# Patient Record
Sex: Female | Born: 1960 | Race: White | Hispanic: No | Marital: Married | Smoking: Never smoker
Health system: Southern US, Community
[De-identification: ages and names within clinical notes are randomized; demographics above are authoritative.]

## PROBLEM LIST (undated history)

## (undated) DIAGNOSIS — H409 Unspecified glaucoma: Secondary | ICD-10-CM

## (undated) DIAGNOSIS — I10 Essential (primary) hypertension: Secondary | ICD-10-CM

## (undated) HISTORY — PX: BREAST SURGERY: SHX581

## (undated) HISTORY — PX: EYE SURGERY: SHX253

## (undated) HISTORY — DX: Essential (primary) hypertension: I10

## (undated) HISTORY — DX: Unspecified glaucoma: H40.9

---
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
## 2004-08-30 ENCOUNTER — Other Ambulatory Visit: Admission: RE | Admit: 2004-08-30 | Discharge: 2004-08-30 | Payer: Self-pay | Admitting: Gynecology

## 2004-09-29 ENCOUNTER — Encounter: Admission: RE | Admit: 2004-09-29 | Discharge: 2004-09-29 | Payer: Self-pay | Admitting: Gynecology

## 2008-11-05 HISTORY — PX: ABDOMINAL HYSTERECTOMY: SHX81

## 2009-04-29 ENCOUNTER — Encounter: Admission: RE | Admit: 2009-04-29 | Discharge: 2009-04-29 | Payer: Self-pay | Admitting: Gynecology

## 2009-05-12 ENCOUNTER — Encounter: Admission: RE | Admit: 2009-05-12 | Discharge: 2009-05-12 | Payer: Self-pay | Admitting: Gynecology

## 2010-05-03 ENCOUNTER — Encounter: Admission: RE | Admit: 2010-05-03 | Discharge: 2010-05-03 | Payer: Self-pay | Admitting: Gynecology

## 2011-04-30 ENCOUNTER — Other Ambulatory Visit: Payer: Self-pay | Admitting: Gynecology

## 2011-04-30 DIAGNOSIS — Z1231 Encounter for screening mammogram for malignant neoplasm of breast: Secondary | ICD-10-CM

## 2011-05-08 ENCOUNTER — Ambulatory Visit: Payer: Self-pay

## 2011-05-10 ENCOUNTER — Ambulatory Visit
Admission: RE | Admit: 2011-05-10 | Discharge: 2011-05-10 | Disposition: A | Discharge status: Home / Self Care | Payer: Federal, State, Local not specified - PPO | Source: Ambulatory Visit | Attending: Gynecology | Admitting: Gynecology

## 2011-05-10 DIAGNOSIS — Z1231 Encounter for screening mammogram for malignant neoplasm of breast: Secondary | ICD-10-CM

## 2012-04-03 ENCOUNTER — Other Ambulatory Visit: Payer: Self-pay | Admitting: Gynecology

## 2012-04-03 DIAGNOSIS — Z1231 Encounter for screening mammogram for malignant neoplasm of breast: Secondary | ICD-10-CM

## 2012-05-16 ENCOUNTER — Ambulatory Visit: Payer: Federal, State, Local not specified - PPO

## 2012-05-26 ENCOUNTER — Ambulatory Visit: Payer: Federal, State, Local not specified - PPO

## 2012-06-18 ENCOUNTER — Ambulatory Visit
Admission: RE | Admit: 2012-06-18 | Discharge: 2012-06-18 | Disposition: A | Discharge status: Home / Self Care | Payer: Federal, State, Local not specified - PPO | Source: Ambulatory Visit | Attending: Gynecology | Admitting: Gynecology

## 2012-06-18 DIAGNOSIS — Z1231 Encounter for screening mammogram for malignant neoplasm of breast: Secondary | ICD-10-CM

## 2012-09-23 DIAGNOSIS — H547 Unspecified visual loss: Secondary | ICD-10-CM | POA: Insufficient documentation

## 2012-12-12 ENCOUNTER — Other Ambulatory Visit: Payer: Self-pay | Admitting: *Deleted

## 2012-12-12 DIAGNOSIS — R911 Solitary pulmonary nodule: Secondary | ICD-10-CM

## 2012-12-15 ENCOUNTER — Ambulatory Visit
Admission: RE | Admit: 2012-12-15 | Discharge: 2012-12-15 | Disposition: A | Discharge status: Home / Self Care | Payer: Federal, State, Local not specified - PPO | Source: Ambulatory Visit | Attending: *Deleted | Admitting: *Deleted

## 2012-12-15 DIAGNOSIS — R911 Solitary pulmonary nodule: Secondary | ICD-10-CM

## 2012-12-15 DIAGNOSIS — R0683 Snoring: Secondary | ICD-10-CM | POA: Insufficient documentation

## 2013-08-20 ENCOUNTER — Other Ambulatory Visit: Payer: Self-pay

## 2013-08-20 DIAGNOSIS — Z1231 Encounter for screening mammogram for malignant neoplasm of breast: Secondary | ICD-10-CM

## 2013-09-18 ENCOUNTER — Ambulatory Visit
Admission: RE | Admit: 2013-09-18 | Discharge: 2013-09-18 | Disposition: A | Discharge status: Home / Self Care | Payer: Federal, State, Local not specified - PPO | Source: Ambulatory Visit

## 2013-09-18 DIAGNOSIS — Z1231 Encounter for screening mammogram for malignant neoplasm of breast: Secondary | ICD-10-CM

## 2013-10-28 ENCOUNTER — Other Ambulatory Visit (HOSPITAL_COMMUNITY)
Admission: RE | Admit: 2013-10-28 | Discharge: 2013-10-28 | Disposition: A | Discharge status: Home / Self Care | Payer: Federal, State, Local not specified - PPO | Source: Ambulatory Visit | Attending: Gynecology | Admitting: Gynecology

## 2013-10-28 ENCOUNTER — Ambulatory Visit (INDEPENDENT_AMBULATORY_CARE_PROVIDER_SITE_OTHER): Payer: Federal, State, Local not specified - PPO | Admitting: Gynecology

## 2013-10-28 ENCOUNTER — Encounter: Payer: Self-pay | Admitting: Gynecology

## 2013-10-28 VITALS — BP 110/68 | Ht 62.0 in | Wt 190.4 lb

## 2013-10-28 DIAGNOSIS — Z01419 Encounter for gynecological examination (general) (routine) without abnormal findings: Secondary | ICD-10-CM | POA: Insufficient documentation

## 2013-10-28 DIAGNOSIS — Z79890 Hormone replacement therapy (postmenopausal): Secondary | ICD-10-CM

## 2013-10-28 MED ORDER — ESTRADIOL 0.05 MG/24HR TD PTTW: 1.0000 | MEDICATED_PATCH | TRANSDERMAL | Status: DC

## 2013-10-28 NOTE — Progress Notes (Signed)
Shelly Stanton 31-Jul-1961 [id number]        52 y.o.  G0P0 new patient for annual exam.  Former patient of Dr. Nicholas Lose. Several issues noted below.  Past medical history,surgical history, problem list, medications, allergies, family history and social history were all reviewed and documented in the EPIC chart.  ROS:  Performed and pertinent positives and negatives are included in the history, assessment and plan .  Exam: Shelly Stanton Vitals:   10/28/13 0920  BP: 110/68  Height: 5\' 2"  (1.575 m)  Weight: 190 lb 6.4 oz (86.365 kg)   General appearance  Normal Skin grossly normal Head/Neck normal with no cervical or supraclavicular adenopathy thyroid normal Lungs  clear Cardiac RR, without RMG Abdominal  soft, nontender, without masses, organomegaly or hernia Breasts  examined lying and sitting without masses, retractions, discharge or axillary adenopathy. Bilateral reduction scars  Pelvic  Ext/BUS/vagina  Normal Pap of cuff done  Adnexa  Without masses or tenderness    Anus and perineum  Normal   Rectovaginal  Normal sphincter tone without palpated masses or tenderness.    Assessment/Plan:  52 y.o. G0P0 female for annual exam.   1.  Status post TAH/BSO for endometriosis. Was done at [facility] for fear of ovarian cancer due to large cystic mass that was benign. Has been on ERT of Minivelle 0.05 since. Has done well with this and wants to continue.  I reviewed the whole issue of HRT with her to include the WHI study with increased risk of stroke, heart attack, DVT and breast cancer. The ACOG and NAMS statements for lowest dose for the shortest period of time reviewed. Transdermal versus oral first-pass effect benefit discussed.  After lengthy discussion the patient wants to continue it I refilled her x1 year. 2. Pap smear thought to be last year. Do not have copies of these. No history of abnormal Pap smears previously by her history. I did a Pap of the cuff today. Options to stop  screening altogether as she is status post hysterectomy for benign indications or less frequent screening intervals reviewed. We'll readdress on an annual basis. 3. Mammography 09/2013. We'll continue with annual mammography. 4. Colonoscopy 2011. Repeat at their recommended interval. 5. DEXA never. We'll plan closer to 61. Increase calcium vitamin D reviewed. 6. Health maintenance. No blood work done as she reports this is all done through her primary physician's office. Followup one year, sooner as needed.   Note: This document was prepared with digital dictation and possible smart phrase technology. Any transcriptional errors that result from this process are unintentional.   Shelly Lords MD, 9:59 AM 10/28/2013

## 2013-10-28 NOTE — Patient Instructions (Signed)
Follow up in one year, sooner as needed. 

## 2013-10-28 NOTE — Addendum Note (Signed)
Addended by: Richardson Chiquito on: 10/28/2013 11:26 AM   Modules accepted: Orders

## 2013-10-29 LAB — URINALYSIS W MICROSCOPIC + REFLEX CULTURE
Bacteria, UA: NONE SEEN
Bilirubin Urine: NEGATIVE
Casts: NONE SEEN
Crystals: NONE SEEN
Glucose, UA: NEGATIVE mg/dL
Hgb urine dipstick: NEGATIVE
Ketones, ur: NEGATIVE mg/dL
Leukocytes, UA: NEGATIVE
Nitrite: NEGATIVE
Protein, ur: NEGATIVE mg/dL
Specific Gravity, Urine: 1.011 (ref 1.005–1.030)
Urobilinogen, UA: 0.2 mg/dL (ref 0.0–1.0)
pH: 7 (ref 5.0–8.0)

## 2013-11-06 ENCOUNTER — Other Ambulatory Visit: Payer: Self-pay | Admitting: Gynecology

## 2013-11-06 MED ORDER — FLUCONAZOLE 150 MG PO TABS: 150.0000 mg | ORAL_TABLET | Freq: Once | ORAL | Status: DC

## 2014-03-25 DIAGNOSIS — N951 Menopausal and female climacteric states: Secondary | ICD-10-CM | POA: Insufficient documentation

## 2014-08-02 ENCOUNTER — Telehealth: Payer: Self-pay | Admitting: *Deleted

## 2014-08-02 ENCOUNTER — Other Ambulatory Visit: Payer: Self-pay

## 2014-08-02 DIAGNOSIS — Z1231 Encounter for screening mammogram for malignant neoplasm of breast: Secondary | ICD-10-CM

## 2014-08-02 NOTE — Telephone Encounter (Signed)
Pt called requesting name of breast imaging center I gave her the name of the breast center of [place].

## 2014-09-20 ENCOUNTER — Ambulatory Visit: Payer: Federal, State, Local not specified - PPO

## 2014-10-12 ENCOUNTER — Ambulatory Visit
Admission: RE | Admit: 2014-10-12 | Discharge: 2014-10-12 | Disposition: A | Discharge status: Home / Self Care | Payer: Federal, State, Local not specified - PPO | Source: Ambulatory Visit

## 2014-10-12 DIAGNOSIS — Z1231 Encounter for screening mammogram for malignant neoplasm of breast: Secondary | ICD-10-CM

## 2014-11-01 ENCOUNTER — Ambulatory Visit (INDEPENDENT_AMBULATORY_CARE_PROVIDER_SITE_OTHER): Payer: Federal, State, Local not specified - PPO | Admitting: Gynecology

## 2014-11-01 ENCOUNTER — Encounter: Payer: Self-pay | Admitting: Gynecology

## 2014-11-01 ENCOUNTER — Other Ambulatory Visit: Payer: Self-pay | Admitting: Gynecology

## 2014-11-01 VITALS — BP 126/82 | Ht 62.0 in | Wt 186.0 lb

## 2014-11-01 DIAGNOSIS — Z01419 Encounter for gynecological examination (general) (routine) without abnormal findings: Secondary | ICD-10-CM

## 2014-11-01 DIAGNOSIS — Z79890 Hormone replacement therapy (postmenopausal): Secondary | ICD-10-CM

## 2014-11-01 MED ORDER — ESTRADIOL 0.05 MG/24HR TD PTTW: 1.0000 | MEDICATED_PATCH | TRANSDERMAL | Status: DC

## 2014-11-01 NOTE — Patient Instructions (Signed)
You may obtain a copy of any labs that were done today by logging onto MyChart as outlined in the instructions provided with your AVS (after visit summary). The office will not call with normal lab results but certainly if there are any significant abnormalities then we will contact you.   Health Maintenance, Female A healthy lifestyle and preventative care can promote health and wellness.  Maintain regular health, dental, and eye exams.  Eat a healthy diet. Foods like vegetables, fruits, whole grains, low-fat dairy products, and lean protein foods contain the nutrients you need without too many calories. Decrease your intake of foods high in solid fats, added sugars, and salt. Get information about a proper diet from your caregiver, if necessary.  Regular physical exercise is one of the most important things you can do for your health. Most adults should get at least 150 minutes of moderate-intensity exercise (any activity that increases your heart rate and causes you to sweat) each week. In addition, most adults need muscle-strengthening exercises on 2 or more days a week.   Maintain a healthy weight. The body mass index (BMI) is a screening tool to identify possible weight problems. It provides an estimate of body fat based on height and weight. Your caregiver can help determine your BMI, and can help you achieve or maintain a healthy weight. For adults 20 years and older:  A BMI below 18.5 is considered underweight.  A BMI of 18.5 to 24.9 is normal.  A BMI of 25 to 29.9 is considered overweight.  A BMI of 30 and above is considered obese.  Maintain normal blood lipids and cholesterol by exercising and minimizing your intake of saturated fat. Eat a balanced diet with plenty of fruits and vegetables. Blood tests for lipids and cholesterol should begin at age 61 and be repeated every 5 years. If your lipid or cholesterol levels are high, you are over 50, or you are a high risk for heart  disease, you may need your cholesterol levels checked more frequently.Ongoing high lipid and cholesterol levels should be treated with medicines if diet and exercise are not effective.  If you smoke, find out from your caregiver how to quit. If you do not use tobacco, do not start.  Lung cancer screening is recommended for adults aged 33 80 years who are at high risk for developing lung cancer because of a history of smoking. Yearly low-dose computed tomography (CT) is recommended for people who have at least a 30-pack-year history of smoking and are a current smoker or have quit within the past 15 years. A pack year of smoking is smoking an average of 1 pack of cigarettes a day for 1 year (for example: 1 pack a day for 30 years or 2 packs a day for 15 years). Yearly screening should continue until the smoker has stopped smoking for at least 15 years. Yearly screening should also be stopped for people who develop a health problem that would prevent them from having lung cancer treatment.  If you are pregnant, do not drink alcohol. If you are breastfeeding, be very cautious about drinking alcohol. If you are not pregnant and choose to drink alcohol, do not exceed 1 drink per day. One drink is considered to be 12 ounces (355 mL) of beer, 5 ounces (148 mL) of wine, or 1.5 ounces (44 mL) of liquor.  Avoid use of street drugs. Do not share needles with anyone. Ask for help if you need support or instructions about stopping  the use of drugs.  High blood pressure causes heart disease and increases the risk of stroke. Blood pressure should be checked at least every 1 to 2 years. Ongoing high blood pressure should be treated with medicines, if weight loss and exercise are not effective.  If you are 59 to 52 years old, ask your caregiver if you should take aspirin to prevent strokes.  Diabetes screening involves taking a blood sample to check your fasting blood sugar level. This should be done once every 3  years, after age 91, if you are within normal weight and without risk factors for diabetes. Testing should be considered at a younger age or be carried out more frequently if you are overweight and have at least 1 risk factor for diabetes.  Breast cancer screening is essential preventative care for women. You should practice "breast self-awareness." This means understanding the normal appearance and feel of your breasts and may include breast self-examination. Any changes detected, no matter how small, should be reported to a caregiver. Women in their 66s and 30s should have a clinical breast exam (CBE) by a caregiver as part of a regular health exam every 1 to 3 years. After age 101, women should have a CBE every year. Starting at age 100, women should consider having a mammogram (breast X-ray) every year. Women who have a family history of breast cancer should talk to their caregiver about genetic screening. Women at a high risk of breast cancer should talk to their caregiver about having an MRI and a mammogram every year.  Breast cancer gene (BRCA)-related cancer risk assessment is recommended for women who have family members with BRCA-related cancers. BRCA-related cancers include breast, ovarian, tubal, and peritoneal cancers. Having family members with these cancers may be associated with an increased risk for harmful changes (mutations) in the breast cancer genes BRCA1 and BRCA2. Results of the assessment will determine the need for genetic counseling and BRCA1 and BRCA2 testing.  The Pap test is a screening test for cervical cancer. Women should have a Pap test starting at age 57. Between ages 25 and 35, Pap tests should be repeated every 2 years. Beginning at age 37, you should have a Pap test every 3 years as long as the past 3 Pap tests have been normal. If you had a hysterectomy for a problem that was not cancer or a condition that could lead to cancer, then you no longer need Pap tests. If you are  between ages 50 and 76, and you have had normal Pap tests going back 10 years, you no longer need Pap tests. If you have had past treatment for cervical cancer or a condition that could lead to cancer, you need Pap tests and screening for cancer for at least 20 years after your treatment. If Pap tests have been discontinued, risk factors (such as a new sexual partner) need to be reassessed to determine if screening should be resumed. Some women have medical problems that increase the chance of getting cervical cancer. In these cases, your caregiver may recommend more frequent screening and Pap tests.  The human papillomavirus (HPV) test is an additional test that may be used for cervical cancer screening. The HPV test looks for the virus that can cause the cell changes on the cervix. The cells collected during the Pap test can be tested for HPV. The HPV test could be used to screen women aged 44 years and older, and should be used in women of any age  who have unclear Pap test results. After the age of 55, women should have HPV testing at the same frequency as a Pap test.  Colorectal cancer can be detected and often prevented. Most routine colorectal cancer screening begins at the age of 44 and continues through age 20. However, your caregiver may recommend screening at an earlier age if you have risk factors for colon cancer. On a yearly basis, your caregiver may provide home test kits to check for hidden blood in the stool. Use of a small camera at the end of a tube, to directly examine the colon (sigmoidoscopy or colonoscopy), can detect the earliest forms of colorectal cancer. Talk to your caregiver about this at age 86, when routine screening begins. Direct examination of the colon should be repeated every 5 to 10 years through age 13, unless early forms of pre-cancerous polyps or small growths are found.  Hepatitis C blood testing is recommended for all people born from [birth year] through 1964 and any  individual with known risks for hepatitis C.  Practice safe sex. Use condoms and avoid high-risk sexual practices to reduce the spread of sexually transmitted infections (STIs). Sexually active women aged 36 and younger should be checked for Chlamydia, which is a common sexually transmitted infection. Older women with new or multiple partners should also be tested for Chlamydia. Testing for other STIs is recommended if you are sexually active and at increased risk.  Osteoporosis is a disease in which the bones lose minerals and strength with aging. This can result in serious bone fractures. The risk of osteoporosis can be identified using a bone density scan. Women ages 20 and over and women at risk for fractures or osteoporosis should discuss screening with their caregivers. Ask your caregiver whether you should be taking a calcium supplement or vitamin D to reduce the rate of osteoporosis.  Menopause can be associated with physical symptoms and risks. Hormone replacement therapy is available to decrease symptoms and risks. You should talk to your caregiver about whether hormone replacement therapy is right for you.  Use sunscreen. Apply sunscreen liberally and repeatedly throughout the day. You should seek shade when your shadow is shorter than you. Protect yourself by wearing long sleeves, pants, a wide-brimmed hat, and sunglasses year round, whenever you are outdoors.  Notify your caregiver of new moles or changes in moles, especially if there is a change in shape or color. Also notify your caregiver if a mole is larger than the size of a pencil eraser.  Stay current with your immunizations. Document Released: 05/07/2011 Document Revised: 02/16/2013 Document Reviewed: 05/07/2011 [facility] Patient Information 2014 [employer].

## 2014-11-01 NOTE — Progress Notes (Signed)
Tanya NonesSylvia C Knab June 30, 1961 [id number]        53 y.o.  G0P0 for annual exam.  Doing well. Several issues noted below.  Past medical history,surgical history, problem list, medications, allergies, family history and social history were all reviewed and documented as reviewed in the EPIC chart.  ROS:  Performed with pertinent positives and negatives included in the history, assessment and plan.   Additional significant findings :  none   Exam: Charity fundraiserBlanca assistant Filed Vitals:   11/01/14 0930  BP: 126/82  Height: 5\' 2"  (1.575 m)  Weight: 186 lb (84.369 kg)   General appearance:  Normal affect, orientation and appearance. Skin: Grossly normal HEENT: Without gross lesions.  No cervical or supraclavicular adenopathy. Thyroid normal.  Lungs:  Clear without wheezing, rales or rhonchi Cardiac: RR, without RMG Abdominal:  Soft, nontender, without masses, guarding, rebound, organomegaly or hernia Breasts:  Examined lying and sitting without masses, retractions, discharge or axillary adenopathy.  Bilateral reduction scars noted. Pelvic:  Ext/BUS/vagina normal  Adnexa  Without masses or tenderness    Anus and perineum  Normal   Rectovaginal  Normal sphincter tone without palpated masses or tenderness.    Assessment/Plan:  53 y.o. G0P0 female for annual exam.   1. Status post TAH/BSO for endometriosis. Continues on MiniVivelle 0.05 mg patches. Doing well and wants to continue. I again reviewed the whole issue of HRT to include increased risk of stroke heart attack DVT and breast cancer. The WHI study as well as ACOG and NAMS statements for lowest dose per shortest period of time all reviewed. Patient's comfortable with continuing I refilled her 1 year. 2. Pap smear 2014. No Pap smear done today. No history of significant abnormal Pap smears. Options to stop screening altogether or less frequent screening intervals reviewed. Will readdress on an annual basis. 3. Mammography 10/2014. Continue with  annual mammography. SBE monthly reviewed. 4. DEXA never. We'll plan at age 53. Patient is on ERT which certainly should help as far as bone health.  Increased calcium vitamin D recommendations reviewed. 5. Colonoscopy 2011. Repeat at their recommended interval. 6. Health maintenance. No routine blood work done as she reports this done at her primary physician's office. Follow up 1 year, sooner as needed.     Dara LordsFONTAINE,Gwendolen Hewlett P MD, 9:54 AM 11/01/2014

## 2014-11-02 LAB — URINALYSIS W MICROSCOPIC + REFLEX CULTURE
Bilirubin Urine: NEGATIVE
CASTS: NONE SEEN
Crystals: NONE SEEN
Glucose, UA: NEGATIVE mg/dL
HGB URINE DIPSTICK: NEGATIVE
KETONES UR: NEGATIVE mg/dL
NITRITE: NEGATIVE
PH: 5.5 (ref 5.0–8.0)
Protein, ur: NEGATIVE mg/dL
Specific Gravity, Urine: 1.012 (ref 1.005–1.030)
UROBILINOGEN UA: 0.2 mg/dL (ref 0.0–1.0)

## 2014-11-03 LAB — URINE CULTURE: Colony Count: >=100000

## 2014-11-04 ENCOUNTER — Other Ambulatory Visit: Payer: Self-pay | Admitting: Gynecology

## 2014-11-04 DIAGNOSIS — R8271 Bacteriuria: Secondary | ICD-10-CM

## 2014-11-12 ENCOUNTER — Other Ambulatory Visit: Payer: Federal, State, Local not specified - PPO

## 2014-11-12 DIAGNOSIS — R8271 Bacteriuria: Secondary | ICD-10-CM

## 2014-11-13 LAB — URINALYSIS W MICROSCOPIC + REFLEX CULTURE
BILIRUBIN URINE: NEGATIVE
Bacteria, UA: NONE SEEN
Casts: NONE SEEN
Crystals: NONE SEEN
Glucose, UA: NEGATIVE mg/dL
Hgb urine dipstick: NEGATIVE
Ketones, ur: NEGATIVE mg/dL
Leukocytes, UA: NEGATIVE
Nitrite: NEGATIVE
Protein, ur: NEGATIVE mg/dL
Specific Gravity, Urine: 1.006 (ref 1.005–1.030)
Squamous Epithelial / LPF: NONE SEEN
UROBILINOGEN UA: 0.2 mg/dL (ref 0.0–1.0)
pH: 6.5 (ref 5.0–8.0)

## 2015-09-26 ENCOUNTER — Telehealth: Payer: Self-pay | Admitting: *Deleted

## 2015-09-26 ENCOUNTER — Other Ambulatory Visit: Payer: Self-pay

## 2015-09-26 DIAGNOSIS — Z1231 Encounter for screening mammogram for malignant neoplasm of breast: Secondary | ICD-10-CM

## 2015-09-26 MED ORDER — ESTRADIOL 0.05 MG/24HR TD PTTW: 1.0000 | MEDICATED_PATCH | TRANSDERMAL | Status: DC

## 2015-09-26 NOTE — Telephone Encounter (Signed)
Pt has annual scheduled on 11/03/15, needs refill on minivelle patch, Rx sent.

## 2015-10-28 ENCOUNTER — Ambulatory Visit
Admission: RE | Admit: 2015-10-28 | Discharge: 2015-10-28 | Disposition: A | Discharge status: Home / Self Care | Payer: Federal, State, Local not specified - PPO | Source: Ambulatory Visit

## 2015-10-28 DIAGNOSIS — Z1231 Encounter for screening mammogram for malignant neoplasm of breast: Secondary | ICD-10-CM

## 2015-11-03 ENCOUNTER — Ambulatory Visit (INDEPENDENT_AMBULATORY_CARE_PROVIDER_SITE_OTHER): Payer: Federal, State, Local not specified - PPO | Admitting: Gynecology

## 2015-11-03 ENCOUNTER — Encounter: Payer: Self-pay | Admitting: Gynecology

## 2015-11-03 VITALS — BP 128/84 | Ht 62.0 in | Wt 197.6 lb

## 2015-11-03 DIAGNOSIS — Z01419 Encounter for gynecological examination (general) (routine) without abnormal findings: Secondary | ICD-10-CM

## 2015-11-03 DIAGNOSIS — Z79890 Hormone replacement therapy (postmenopausal): Secondary | ICD-10-CM | POA: Diagnosis not present

## 2015-11-03 MED ORDER — ESTRADIOL 0.05 MG/24HR TD PTTW: MEDICATED_PATCH | TRANSDERMAL | Status: DC

## 2015-11-03 NOTE — Progress Notes (Signed)
Tanya NonesSylvia C Forrest 21-Nov-1960 [id number]        54 y.o.  G0P0  for annual exam.  Doing well without complaints  Past medical history,surgical history, problem list, medications, allergies, family history and social history were all reviewed and documented as reviewed in the EPIC chart.  ROS:  Performed with pertinent positives and negatives included in the history, assessment and plan.   Additional significant findings :  none   Exam: Charity fundraiserBlanca assistant Filed Vitals:   11/03/15 1000  BP: 128/84  Height: 5\' 2"  (1.575 m)  Weight: 197 lb 9.6 oz (89.631 kg)   General appearance:  Normal affect, orientation and appearance. Skin: Grossly normal HEENT: Without gross lesions.  No cervical or supraclavicular adenopathy. Thyroid normal.  Lungs:  Clear without wheezing, rales or rhonchi Cardiac: RR, without RMG Abdominal:  Soft, nontender, without masses, guarding, rebound, organomegaly or hernia Breasts:  Examined lying and sitting without masses, retractions, discharge or axillary adenopathy. Pelvic:  Ext/BUS/vagina normal with mild atrophic changes  Adnexa  Without masses or tenderness    Anus and perineum  Normal   Rectovaginal  Normal sphincter tone without palpated masses or tenderness.    Assessment/Plan:  54 y.o. G0P0 female for annual exam.   1. Status post TAH/BSO for endometriosis. Continues on minivelle 0.05 mg patches. She is doing well and wants to continue. I again reviewed the whole issue of HRT to include the WHI study with increased risk of stroke heart attack DVT and breast cancer. The recommendations for lowest dose for shortest period of time discussed. At this point she wants to continue understands and accepts risks and I refilled her 1 year. 2. Pap smear 2014. No Pap smear done today. No history of abnormal Pap smears. Options to stop screening per current screening guidelines as she is status post hysterectomy for benign indications versus less frequent screening  intervals reviewed. Will readdress on annual basis. 3. Mammography last week. Continue with annual mammography when due. SBE monthly reviewed. 4. Colonoscopy 2016. Repeat at their recommended interval. 5. DEXA never. We'll plan at age 54. Increase calcium and vitamin D reviewed. 6. Health maintenance. No routine blood work done as this is done at her primary physician's office. Follow up 1 year, sooner as needed.   Dara LordsFONTAINE,TIMOTHY P MD, 10:16 AM 11/03/2015

## 2015-11-03 NOTE — Patient Instructions (Signed)
You may obtain a copy of any labs that were done today by logging onto MyChart as outlined in the instructions provided with your AVS (after visit summary). The office will not call with normal lab results but certainly if there are any significant abnormalities then we will contact you.   Health Maintenance Adopting a healthy lifestyle and getting preventive care can go a long way to promote health and wellness. Talk with your health care Lilley Hubble about what schedule of regular examinations is right for you. This is a good chance for you to check in with your Kiano Terrien about disease prevention and staying healthy. In between checkups, there are plenty of things you can do on your own. Experts have done a lot of research about which lifestyle changes and preventive measures are most likely to keep you healthy. Ask your health care Jamani Eley for more information. WEIGHT AND DIET  Eat a healthy diet  Be sure to include plenty of vegetables, fruits, low-fat dairy products, and lean protein.  Do not eat a lot of foods high in solid fats, added sugars, or salt.  Get regular exercise. This is one of the most important things you can do for your health.  Most adults should exercise for at least 150 minutes each week. The exercise should increase your heart rate and make you sweat (moderate-intensity exercise).  Most adults should also do strengthening exercises at least twice a week. This is in addition to the moderate-intensity exercise.  Maintain a healthy weight  Body mass index (BMI) is a measurement that can be used to identify possible weight problems. It estimates body fat based on height and weight. Your health care Joffrey Kerce can help determine your BMI and help you achieve or maintain a healthy weight.  For females 43 years of age and older:   A BMI below 18.5 is considered underweight.  A BMI of 18.5 to 24.9 is normal.  A BMI of 25 to 29.9 is considered overweight.  A BMI of 30 and  above is considered obese.  Watch levels of cholesterol and blood lipids  You should start having your blood tested for lipids and cholesterol at 53 years of age, then have this test every 5 years.  You may need to have your cholesterol levels checked more often if:  Your lipid or cholesterol levels are high.  You are older than 53 years of age.  You are at high risk for heart disease.  CANCER SCREENING   Lung Cancer  Lung cancer screening is recommended for adults 8-51 years old who are at high risk for lung cancer because of a history of smoking.  A yearly low-dose CT scan of the lungs is recommended for people who:  Currently smoke.  Have quit within the past 15 years.  Have at least a 30-pack-year history of smoking. A pack year is smoking an average of one pack of cigarettes a day for 1 year.  Yearly screening should continue until it has been 15 years since you quit.  Yearly screening should stop if you develop a health problem that would prevent you from having lung cancer treatment.  Breast Cancer  Practice breast self-awareness. This means understanding how your breasts normally appear and feel.  It also means doing regular breast self-exams. Let your health care Fredda Clarida know about any changes, no matter how small.  If you are in your 20s or 30s, you should have a clinical breast exam (CBE) by a health care Neiva Maenza every 1-3  years as part of a regular health exam.  If you are 32 or older, have a CBE every year. Also consider having a breast X-ray (mammogram) every year.  If you have a family history of breast cancer, talk to your health care Sina Lucchesi about genetic screening.  If you are at high risk for breast cancer, talk to your health care Jimmi Sidener about having an MRI and a mammogram every year.  Breast cancer gene (BRCA) assessment is recommended for women who have family members with BRCA-related cancers. BRCA-related cancers  include:  Breast.  Ovarian.  Tubal.  Peritoneal cancers.  Results of the assessment will determine the need for genetic counseling and BRCA1 and BRCA2 testing. Cervical Cancer Routine pelvic examinations to screen for cervical cancer are no longer recommended for nonpregnant women who are considered low risk for cancer of the pelvic organs (ovaries, uterus, and vagina) and who do not have symptoms. A pelvic examination may be necessary if you have symptoms including those associated with pelvic infections. Ask your health care Tanvir Hipple if a screening pelvic exam is right for you.   The Pap test is the screening test for cervical cancer for women who are considered at risk.  If you had a hysterectomy for a problem that was not cancer or a condition that could lead to cancer, then you no longer need Pap tests.  If you are older than 65 years, and you have had normal Pap tests for the past 10 years, you no longer need to have Pap tests.  If you have had past treatment for cervical cancer or a condition that could lead to cancer, you need Pap tests and screening for cancer for at least 20 years after your treatment.  If you no longer get a Pap test, assess your risk factors if they change (such as having a new sexual partner). This can affect whether you should start being screened again.  Some women have medical problems that increase their chance of getting cervical cancer. If this is the case for you, your health care Andris Brothers may recommend more frequent screening and Pap tests.  The human papillomavirus (HPV) test is another test that may be used for cervical cancer screening. The HPV test looks for the virus that can cause cell changes in the cervix. The cells collected during the Pap test can be tested for HPV.  The HPV test can be used to screen women 34 years of age and older. Getting tested for HPV can extend the interval between normal Pap tests from three to five years.  An HPV  test also should be used to screen women of any age who have unclear Pap test results.  After 53 years of age, women should have HPV testing as often as Pap tests.  Colorectal Cancer  This type of cancer can be detected and often prevented.  Routine colorectal cancer screening usually begins at 53 years of age and continues through 53 years of age.  Your health care Mahmud Keithly may recommend screening at an earlier age if you have risk factors for colon cancer.  Your health care Aerica Rincon may also recommend using home test kits to check for hidden blood in the stool.  A small camera at the end of a tube can be used to examine your colon directly (sigmoidoscopy or colonoscopy). This is done to check for the earliest forms of colorectal cancer.  Routine screening usually begins at age 58.  Direct examination of the colon should be repeated  every 5-10 years through 53 years of age. However, you may need to be screened more often if early forms of precancerous polyps or small growths are found. Skin Cancer  Check your skin from head to toe regularly.  Tell your health care Zubin Pontillo about any new moles or changes in moles, especially if there is a change in a mole's shape or color.  Also tell your health care Moishy Laday if you have a mole that is larger than the size of a pencil eraser.  Always use sunscreen. Apply sunscreen liberally and repeatedly throughout the day.  Protect yourself by wearing long sleeves, pants, a wide-brimmed hat, and sunglasses whenever you are outside. HEART DISEASE, DIABETES, AND HIGH BLOOD PRESSURE   Have your blood pressure checked at least every 1-2 years. High blood pressure causes heart disease and increases the risk of stroke.  If you are between 70 years and 63 years old, ask your health care Noami Bove if you should take aspirin to prevent strokes.  Have regular diabetes screenings. This involves taking a blood sample to check your fasting blood sugar  level.  If you are at a normal weight and have a low risk for diabetes, have this test once every three years after 53 years of age.  If you are overweight and have a high risk for diabetes, consider being tested at a younger age or more often. PREVENTING INFECTION  Hepatitis B  If you have a higher risk for hepatitis B, you should be screened for this virus. You are considered at high risk for hepatitis B if:  You were born in a country where hepatitis B is common. Ask your health care Joushua Dugar which countries are considered high risk.  Your parents were born in a high-risk country, and you have not been immunized against hepatitis B (hepatitis B vaccine).  You have HIV or AIDS.  You use needles to inject street drugs.  You live with someone who has hepatitis B.  You have had sex with someone who has hepatitis B.  You get hemodialysis treatment.  You take certain medicines for conditions, including cancer, organ transplantation, and autoimmune conditions. Hepatitis C  Blood testing is recommended for:  Everyone born from [birth year] through 1964.  Anyone with known risk factors for hepatitis C. Sexually transmitted infections (STIs)  You should be screened for sexually transmitted infections (STIs) including gonorrhea and chlamydia if:  You are sexually active and are younger than 53 years of age.  You are older than 53 years of age and your health care Jashayla Glatfelter tells you that you are at risk for this type of infection.  Your sexual activity has changed since you were last screened and you are at an increased risk for chlamydia or gonorrhea. Ask your health care Sallee Hogrefe if you are at risk.  If you do not have HIV, but are at risk, it may be recommended that you take a prescription medicine daily to prevent HIV infection. This is called pre-exposure prophylaxis (PrEP). You are considered at risk if:  You are sexually active and do not regularly use condoms or know the HIV status  of your partner(s).  You take drugs by injection.  You are sexually active with a partner who has HIV. Talk with your health care Dajon Rowe about whether you are at high risk of being infected with HIV. If you choose to begin PrEP, you should first be tested for HIV. You should then be tested every 3 months for as long as  you are taking PrEP.  PREGNANCY   If you are premenopausal and you may become pregnant, ask your health care Brantley Naser about preconception counseling.  If you may become pregnant, take 400 to 800 micrograms (mcg) of folic acid every day.  If you want to prevent pregnancy, talk to your health care Jennalyn Cawley about birth control (contraception). OSTEOPOROSIS AND MENOPAUSE   Osteoporosis is a disease in which the bones lose minerals and strength with aging. This can result in serious bone fractures. Your risk for osteoporosis can be identified using a bone density scan.  If you are 65 years of age or older, or if you are at risk for osteoporosis and fractures, ask your health care Faust Thorington if you should be screened.  Ask your health care Renna Kilmer whether you should take a calcium or vitamin D supplement to lower your risk for osteoporosis.  Menopause may have certain physical symptoms and risks.  Hormone replacement therapy may reduce some of these symptoms and risks. Talk to your health care Tatanisha Cuthbert about whether hormone replacement therapy is right for you.  HOME CARE INSTRUCTIONS   Schedule regular health, dental, and eye exams.  Stay current with your immunizations.   Do not use any tobacco products including cigarettes, chewing tobacco, or electronic cigarettes.  If you are pregnant, do not drink alcohol.  If you are breastfeeding, limit how much and how often you drink alcohol.  Limit alcohol intake to no more than 1 drink per day for nonpregnant women. One drink equals 12 ounces of beer, 5 ounces of wine, or 1 ounces of hard liquor.  Do not use street  drugs.  Do not share needles.  Ask your health care Aynsley Fleet for help if you need support or information about quitting drugs.  Tell your health care Timiko Offutt if you often feel depressed.  Tell your health care Renarda Mullinix if you have ever been abused or do not feel safe at home. Document Released: 05/07/2011 Document Revised: 03/08/2014 Document Reviewed: 09/23/2013 ExitCare Patient Information 2015 [employer]. This information is not intended to replace advice given to you by your health care Schuyler Behan. Make sure you discuss any questions you have with your health care Toya Palacios.  

## 2016-10-11 ENCOUNTER — Other Ambulatory Visit: Payer: Self-pay | Admitting: Family Medicine

## 2016-10-11 DIAGNOSIS — Z1231 Encounter for screening mammogram for malignant neoplasm of breast: Secondary | ICD-10-CM

## 2016-10-11 DIAGNOSIS — Z9889 Other specified postprocedural states: Secondary | ICD-10-CM

## 2016-11-07 ENCOUNTER — Ambulatory Visit (INDEPENDENT_AMBULATORY_CARE_PROVIDER_SITE_OTHER): Payer: Federal, State, Local not specified - PPO | Admitting: Gynecology

## 2016-11-07 ENCOUNTER — Encounter: Payer: Self-pay | Admitting: Gynecology

## 2016-11-07 VITALS — BP 116/74 | Ht 62.0 in | Wt 197.0 lb

## 2016-11-07 DIAGNOSIS — N952 Postmenopausal atrophic vaginitis: Secondary | ICD-10-CM

## 2016-11-07 DIAGNOSIS — Z79890 Hormone replacement therapy (postmenopausal): Secondary | ICD-10-CM | POA: Diagnosis not present

## 2016-11-07 DIAGNOSIS — Z01411 Encounter for gynecological examination (general) (routine) with abnormal findings: Secondary | ICD-10-CM

## 2016-11-07 MED ORDER — ESTRADIOL 0.05 MG/24HR TD PTTW: MEDICATED_PATCH | TRANSDERMAL | 12 refills | Status: DC

## 2016-11-07 NOTE — Progress Notes (Signed)
    Shelly Stanton 1961-10-24 [id number]        [age] y.o.  G0P0 for annual exam.   Past medical history,surgical history, problem list, medications, allergies, family history and social history were all reviewed and documented as reviewed in the EPIC chart.  ROS:  Performed with pertinent positives and negatives included in the history, assessment and plan.   Additional significant findings :  None   Exam: Kennon PortelaKim Gardner assistant Vitals:   11/07/16 1019  BP: 116/74  Weight: 197 lb (89.4 kg)  Height: 5\' 2"  (1.575 m)   Body mass index is 36.03 kg/m.  General appearance:  Normal affect, orientation and appearance. Skin: Grossly normal HEENT: Without gross lesions.  No cervical or supraclavicular adenopathy. Thyroid normal.  Lungs:  Clear without wheezing, rales or rhonchi Cardiac: RR, without RMG Abdominal:  Soft, nontender, without masses, guarding, rebound, organomegaly or hernia Breasts:  Examined lying and sitting without masses, retractions, discharge or axillary adenopathy. Pelvic:  Ext, BUS, Vagina with atrophic changes  Adnexa without masses or tenderness    Anus and perineum normal   Rectovaginal normal sphincter tone without palpated masses or tenderness.    Assessment/Plan:  56 y.o. G0P0 female for annual exam.atus post.   1. Postmenopausal/HRT. Patient status post TAH/BSO for endometriosis. Is on minivelle 0.5 mg patches doing well once to continue. I reviewed the most recent 2017 [facility] guidelines on HRT to include the benefits of symptom relief, possible cardiovascular/bone health when started early versus risks to include stroke heart attack DVT and breast cancer. Patient at this point wants to continue and I refilled her 1 year. 2. Pap smear 2014. No Pap smear done today. No history of abnormal Pap smears previously. Options to stop screening per current screening guidelines status post hysterectomy for benign indications versus less frequent screening intervals  reviewed. Will readdress on annual basis. 3. Mammography scheduled coming up and patient will follow up for this. SBE monthly reviewed. 4. Colonoscopy 2016. Repeat at their recommended interval. 5. DEXA never. Will plan further into the menopause. Increase calcium vitamin D reviewed. 6. Health maintenance. No routine lab work done as patient reports this done elsewhere. Follow up 1 year, sooner as needed.   Dara LordsFONTAINE,TIMOTHY P MD, 10:34 AM 11/07/2016

## 2016-11-07 NOTE — Patient Instructions (Signed)

## 2016-11-20 ENCOUNTER — Ambulatory Visit: Payer: Federal, State, Local not specified - PPO

## 2016-12-24 ENCOUNTER — Ambulatory Visit
Admission: RE | Admit: 2016-12-24 | Discharge: 2016-12-24 | Disposition: A | Discharge status: Home / Self Care | Payer: Federal, State, Local not specified - PPO | Source: Ambulatory Visit | Attending: Family Medicine | Admitting: Family Medicine

## 2016-12-24 DIAGNOSIS — Z1231 Encounter for screening mammogram for malignant neoplasm of breast: Secondary | ICD-10-CM

## 2016-12-24 DIAGNOSIS — Z9889 Other specified postprocedural states: Secondary | ICD-10-CM

## 2017-11-04 ENCOUNTER — Other Ambulatory Visit: Payer: Self-pay | Admitting: Gynecology

## 2017-11-08 ENCOUNTER — Encounter: Payer: Self-pay | Admitting: Gynecology

## 2017-11-08 ENCOUNTER — Ambulatory Visit (INDEPENDENT_AMBULATORY_CARE_PROVIDER_SITE_OTHER): Payer: Federal, State, Local not specified - PPO | Admitting: Gynecology

## 2017-11-08 VITALS — BP 122/80 | Ht 61.5 in | Wt 187.0 lb

## 2017-11-08 DIAGNOSIS — N952 Postmenopausal atrophic vaginitis: Secondary | ICD-10-CM | POA: Diagnosis not present

## 2017-11-08 DIAGNOSIS — Z01411 Encounter for gynecological examination (general) (routine) with abnormal findings: Secondary | ICD-10-CM | POA: Diagnosis not present

## 2017-11-08 DIAGNOSIS — Z79890 Hormone replacement therapy (postmenopausal): Secondary | ICD-10-CM | POA: Diagnosis not present

## 2017-11-08 MED ORDER — ESTRADIOL 0.05 MG/24HR TD PTTW: MEDICATED_PATCH | TRANSDERMAL | 12 refills | Status: DC

## 2017-11-08 NOTE — Progress Notes (Signed)
    Tanya NonesSylvia C Filsaime 1960/12/04 [id number]        57 y.o.  G0P0 for annual gynecologic exam.  Without gynecologic complaints  Past medical history,surgical history, problem list, medications, allergies, family history and social history were all reviewed and documented as reviewed in the EPIC chart.  ROS:  Performed with pertinent positives and negatives included in the history, assessment and plan.   Additional significant findings : None   Exam: Kennon PortelaKim Gardner assistant Vitals:   11/08/17 1044  BP: 122/80  Weight: 187 lb (84.8 kg)  Height: 5' 1.5" (1.562 m)   Body mass index is 34.76 kg/m.  General appearance:  Normal affect, orientation and appearance. Skin: Grossly normal HEENT: Without gross lesions.  No cervical or supraclavicular adenopathy. Thyroid normal.  Lungs:  Clear without wheezing, rales or rhonchi Cardiac: RR, without RMG Abdominal:  Soft, nontender, without masses, guarding, rebound, organomegaly or hernia Breasts:  Examined lying and sitting without masses, retractions, discharge or axillary adenopathy.  Bilateral reduction scars noted. Pelvic:   Ext, BUS, Vagina: With atrophic changes.  Pap smear of vaginal cuff done  Adnexa: Without masses or tenderness    Anus and perineum: Normal   Rectovaginal: Normal sphincter tone without palpated masses or tenderness.    Assessment/Plan:  57 y.o. G0P0 female for annual gynecologic exam.   1. Postmenopausal/HRT.  Status post TAH/BSO for endometriosis.  On minivelle 0.05 mg patch.  Doing well and wants to continue.  I again reviewed the risks versus benefits of HRT.  Risks of thrombosis such as stroke heart attack DVT as well as the breast cancer issue discussed.  Benefits to include symptom relief, cardiovascular and bone health also reviewed.  Benefits of transdermal delivery reviewed.  Patient's husband just had a stroke several days ago.  At this point we both agree continuing for now.  Refill times 1 year  provided. 2. Mammography coming due in February and I reminded her to schedule this.  Breast exam normal today.  SBE monthly reviewed. 3. Colonoscopy 2016.  Repeat at their recommended interval. 4. Pap smear 2014.  Pap smear done today.  No history of significant abnormal Pap smears.  Options to stop screening per current screening guidelines and hysterectomy history reviewed.  Will readdress on an annual basis. 5. DEXA never.  Will plan further into the menopause. 6. Health maintenance.  No routine lab work done as patient reports this done elsewhere.  Follow-up 1 year, sooner as needed.    Dara Lordsimothy P Raynor Calcaterra MD, 11:20 AM 11/08/2017

## 2017-11-08 NOTE — Addendum Note (Signed)
Addended by: Dayna BarkerGARDNER, Yaslyn Cumby K on: 11/08/2017 11:39 AM   Modules accepted: Orders

## 2017-11-08 NOTE — Patient Instructions (Signed)
Follow-up in 1 year for annual exam, sooner if any issues. 

## 2017-11-11 LAB — PAP IG W/ RFLX HPV ASCU

## 2017-11-12 ENCOUNTER — Other Ambulatory Visit: Payer: Self-pay | Admitting: *Deleted

## 2017-11-12 MED ORDER — FLUCONAZOLE 150 MG PO TABS: 150.0000 mg | ORAL_TABLET | Freq: Once | ORAL | 0 refills | Status: AC

## 2017-12-09 ENCOUNTER — Other Ambulatory Visit: Payer: Self-pay | Admitting: Gynecology

## 2017-12-23 ENCOUNTER — Other Ambulatory Visit: Payer: Self-pay | Admitting: Family Medicine

## 2017-12-23 DIAGNOSIS — Z1231 Encounter for screening mammogram for malignant neoplasm of breast: Secondary | ICD-10-CM

## 2018-01-14 ENCOUNTER — Ambulatory Visit: Payer: Federal, State, Local not specified - PPO

## 2018-07-15 ENCOUNTER — Ambulatory Visit: Payer: Federal, State, Local not specified - PPO

## 2018-07-18 ENCOUNTER — Ambulatory Visit: Payer: Federal, State, Local not specified - PPO

## 2018-08-26 ENCOUNTER — Ambulatory Visit
Admission: RE | Admit: 2018-08-26 | Discharge: 2018-08-26 | Disposition: A | Discharge status: Home / Self Care | Payer: Federal, State, Local not specified - PPO | Source: Ambulatory Visit | Attending: Family Medicine | Admitting: Family Medicine

## 2018-08-26 DIAGNOSIS — Z1231 Encounter for screening mammogram for malignant neoplasm of breast: Secondary | ICD-10-CM

## 2018-11-13 ENCOUNTER — Ambulatory Visit (INDEPENDENT_AMBULATORY_CARE_PROVIDER_SITE_OTHER): Payer: Federal, State, Local not specified - PPO | Admitting: Gynecology

## 2018-11-13 ENCOUNTER — Encounter: Payer: Self-pay | Admitting: Gynecology

## 2018-11-13 VITALS — BP 114/76 | Ht 62.0 in | Wt 174.0 lb

## 2018-11-13 DIAGNOSIS — N952 Postmenopausal atrophic vaginitis: Secondary | ICD-10-CM

## 2018-11-13 DIAGNOSIS — Z01419 Encounter for gynecological examination (general) (routine) without abnormal findings: Secondary | ICD-10-CM | POA: Diagnosis not present

## 2018-11-13 DIAGNOSIS — Z79890 Hormone replacement therapy (postmenopausal): Secondary | ICD-10-CM

## 2018-11-13 MED ORDER — ESTRADIOL 0.05 MG/24HR TD PTTW: MEDICATED_PATCH | TRANSDERMAL | 4 refills | Status: DC

## 2018-11-13 NOTE — Progress Notes (Signed)
    MEYLANI KOMARA 1961/04/09 [id number]        58 y.o.  G0P0 for annual gynecologic exam.  Without gynecologic complaints  Past medical history,surgical history, problem list, medications, allergies, family history and social history were all reviewed and documented as reviewed in the EPIC chart.  ROS:  Performed with pertinent positives and negatives included in the history, assessment and plan.   Additional significant findings : None   Exam: Kennon Portela assistant Vitals:   11/13/18 0957  BP: 114/76  Weight: 174 lb (78.9 kg)  Height: 5\' 2"  (1.575 m)   Body mass index is 31.83 kg/m.  General appearance:  Normal affect, orientation and appearance. Skin: Grossly normal HEENT: Without gross lesions.  No cervical or supraclavicular adenopathy. Thyroid normal.  Lungs:  Clear without wheezing, rales or rhonchi Cardiac: RR, without RMG Abdominal:  Soft, nontender, without masses, guarding, rebound, organomegaly or hernia Breasts:  Examined lying and sitting without masses, retractions, discharge or axillary adenopathy. Pelvic:  Ext, BUS, Vagina: Normal  Adnexa: Without masses or tenderness    Anus and perineum: Normal   Rectovaginal: Normal sphincter tone without palpated masses or tenderness.    Assessment/Plan:  58 y.o. G0P0 female for annual gynecologic exam.   1. Postmenopausal/HRT.  Continues on Minivelle 0.05 mg patch.  Doing well and wants to continue.  History of TAH/BSO for endometriosis.  We again reviewed the risks versus benefits to include increased risk of stroke heart attack DVT in the breast cancer issue.  Benefits to include symptom relief as well as cardiovascular and bone health discussed.  At this point the patient wants to continue and I refilled her x1 year. 2. Mammography 08/2018.  Continue with annual mammography when due.  Breast exam normal today. 3. Pap smear 2019.  No Pap smear done today.  No history of abnormal Pap smears.  Options to stop screening  per current screening guidelines based on hysterectomy history reviewed.  Will readdress on an annual basis. 4. DEXA never.  Will plan further into the menopause. 5. Colonoscopy 2016.  Repeat at their recommended interval. 6. Health maintenance.  No routine lab work done as patient does this elsewhere.  Follow-up 1 year, sooner as needed.   Dara Lords MD, 10:17 AM 11/13/2018

## 2018-11-13 NOTE — Patient Instructions (Signed)
Follow-up in 1 year for annual exam, sooner as needed. 

## 2019-07-22 ENCOUNTER — Other Ambulatory Visit: Payer: Self-pay | Admitting: Gynecology

## 2019-07-22 ENCOUNTER — Other Ambulatory Visit: Payer: Self-pay | Admitting: Emergency Medicine

## 2019-07-22 DIAGNOSIS — Z1231 Encounter for screening mammogram for malignant neoplasm of breast: Secondary | ICD-10-CM

## 2019-08-04 ENCOUNTER — Encounter: Payer: Self-pay | Admitting: Gynecology

## 2019-09-07 ENCOUNTER — Other Ambulatory Visit: Payer: Self-pay

## 2019-09-07 ENCOUNTER — Ambulatory Visit
Admission: RE | Admit: 2019-09-07 | Discharge: 2019-09-07 | Disposition: A | Discharge status: Home / Self Care | Payer: Federal, State, Local not specified - PPO | Source: Ambulatory Visit | Attending: Gynecology | Admitting: Gynecology

## 2019-09-07 DIAGNOSIS — Z1231 Encounter for screening mammogram for malignant neoplasm of breast: Secondary | ICD-10-CM

## 2019-11-23 ENCOUNTER — Encounter: Payer: Federal, State, Local not specified - PPO | Admitting: Gynecology

## 2019-12-03 ENCOUNTER — Other Ambulatory Visit: Payer: Self-pay

## 2019-12-04 ENCOUNTER — Encounter: Payer: Self-pay | Admitting: Obstetrics and Gynecology

## 2019-12-04 ENCOUNTER — Ambulatory Visit (INDEPENDENT_AMBULATORY_CARE_PROVIDER_SITE_OTHER): Payer: Federal, State, Local not specified - PPO | Admitting: Obstetrics and Gynecology

## 2019-12-04 VITALS — BP 124/80 | Ht 62.0 in | Wt 189.0 lb

## 2019-12-04 DIAGNOSIS — Z01419 Encounter for gynecological examination (general) (routine) without abnormal findings: Secondary | ICD-10-CM | POA: Diagnosis not present

## 2019-12-04 MED ORDER — ESTRADIOL 0.05 MG/24HR TD PTTW: MEDICATED_PATCH | TRANSDERMAL | 4 refills | Status: DC

## 2019-12-04 NOTE — Patient Instructions (Signed)
It was nice to meet you today! Please remember to schedule your annual mammogram when the time comes later this year. I did refill the estrogen patch for you.

## 2019-12-04 NOTE — Progress Notes (Signed)
Shelly Stanton 09-14-1961 [id number]  SUBJECTIVE:  59 y.o. G0P0 female for annual routine gynecologic exam. She has no gynecologic concerns.  Her husband was recently hospitalized for over 2 weeks for treatment of a delayed postoperative infection, so now that he is home she is looking forward to getting more rest.  Current Outpatient Medications  Medication Sig Dispense Refill  . BRIMONIDINE TARTRATE OP Apply to eye.    . Cholecalciferol (VITAMIN D PO) Take by mouth.    . DORZOLAMIDE HCL OP Apply to eye.    . estradiol (VIVELLE-DOT) 0.05 MG/24HR patch APPLY 1 PATCH EXTERNALLY TO THE SKIN 2 TIMES A WEEK 24 patch 4  . lisinopril (ZESTRIL) 2.5 MG tablet Take 2.5 mg by mouth daily.    . Misc Natural Products (FIBER 7 PO) Take by mouth.    . Multiple Vitamins-Minerals (OCUVITE ADULT 50+ PO) Take by mouth.    Shelly Stanton (RHOPRESSA) 0.02 % SOLN Apply to eye.    . Travoprost, BAK Free, (TRAVATAN) 0.004 % SOLN ophthalmic solution 1 drop at bedtime.     No current facility-administered medications for this visit.   Allergies: Patient has no known allergies.  No LMP recorded. Patient has had a hysterectomy.  Past medical history,surgical history, problem list, medications, allergies, family history and social history were all reviewed and documented as reviewed in the EPIC chart.  ROS:  Feeling well. No dyspnea or chest pain on exertion.  No abdominal pain, change in bowel habits, black or bloody stools.  No urinary tract symptoms. GYN ROS:  no bleeding, pelvic pain or discharge, no breast pain or new or enlarging lumps on self exam. No neurological complaints.    OBJECTIVE:  BP 124/80   Ht 5\' 2"  (1.575 m)   Wt 189 lb (85.7 kg)   BMI 34.57 kg/m  The patient appears well, alert, oriented x 3, in no distress. ENT normal.  Neck supple. No cervical or supraclavicular adenopathy or thyromegaly.  Lungs are clear, good air entry, no wheezes, rhonchi or rales. S1 and S2 normal, no  murmurs, regular rate and rhythm.  Abdomen soft without tenderness, guarding, mass or organomegaly.  Neurological is normal, no focal findings.  BREAST EXAM: breasts appear normal, no suspicious masses, no skin or nipple changes or axillary nodes  PELVIC EXAM: VULVA: normal appearing vulva with no masses, tenderness or lesions, VAGINA: normal appearing vagina with normal color and discharge, no lesions, CERVIX: surgically absent, UTERUS: surgically absent, vaginal cuff well healed, ADNEXA: no masses, non tender  Shelly Stanton present during the examination  ASSESSMENT:  59 y.o. G0P0 here for annual gynecologic exam  PLAN:  1. Postmenopausal/HRT.  She does want to continue on Minivelle 0.05 mg patch.  She has a history of a TAH/BSO for endometriosis.  We briefly reviewed the risks for use of estrogen therapy including thrombotic diseases, heart attack, stroke, DVT, PE, increased risk for breast cancer.  Benefits of relief of symptoms, and bone health are reviewed.  I have refilled her medication for another year supply. 2. Pap smear 2019. Not repeated today. No prior history of abnormal Pap smears.  We will address next year if she wishes to continue surveillance based on history and previous hysterectomy.   3. Mammogram 08/2018. Will continue with annual mammography. Breast exam normal today. 4. Colonoscopy 2016. Recommended that she continue per the prescribed interval.  5. DEXA when indicated later into menopause. 6. Health maintenance.  No lab work as she has this completed  with her primary care provider.    Return annually or sooner, prn.  Ilda Foil MD  12/04/19

## 2020-05-03 DIAGNOSIS — H401113 Primary open-angle glaucoma, right eye, severe stage: Secondary | ICD-10-CM | POA: Insufficient documentation

## 2020-05-31 DIAGNOSIS — H401122 Primary open-angle glaucoma, left eye, moderate stage: Secondary | ICD-10-CM | POA: Insufficient documentation

## 2020-08-05 ENCOUNTER — Other Ambulatory Visit: Payer: Self-pay | Admitting: Family Medicine

## 2020-08-05 DIAGNOSIS — Z1231 Encounter for screening mammogram for malignant neoplasm of breast: Secondary | ICD-10-CM

## 2020-09-15 ENCOUNTER — Ambulatory Visit
Admission: RE | Admit: 2020-09-15 | Discharge: 2020-09-15 | Disposition: A | Discharge status: Home / Self Care | Payer: Federal, State, Local not specified - PPO | Source: Ambulatory Visit | Attending: Family Medicine | Admitting: Family Medicine

## 2020-09-15 ENCOUNTER — Other Ambulatory Visit: Payer: Self-pay

## 2020-09-15 DIAGNOSIS — Z1231 Encounter for screening mammogram for malignant neoplasm of breast: Secondary | ICD-10-CM

## 2020-11-05 HISTORY — PX: TRABECULECTOMY: SHX107

## 2020-12-08 ENCOUNTER — Other Ambulatory Visit: Payer: Self-pay

## 2020-12-08 ENCOUNTER — Ambulatory Visit: Payer: Federal, State, Local not specified - PPO | Admitting: Obstetrics and Gynecology

## 2020-12-08 ENCOUNTER — Encounter: Payer: Self-pay | Admitting: Obstetrics and Gynecology

## 2020-12-08 VITALS — BP 138/88 | Ht 61.5 in | Wt 187.0 lb

## 2020-12-08 DIAGNOSIS — Z01419 Encounter for gynecological examination (general) (routine) without abnormal findings: Secondary | ICD-10-CM

## 2020-12-08 DIAGNOSIS — Z79890 Hormone replacement therapy (postmenopausal): Secondary | ICD-10-CM | POA: Diagnosis not present

## 2020-12-08 MED ORDER — ESTRADIOL 0.05 MG/24HR TD PTTW: MEDICATED_PATCH | TRANSDERMAL | 4 refills | Status: DC

## 2020-12-08 NOTE — Progress Notes (Addendum)
   Shelly Stanton 1960-12-12 [id number]  SUBJECTIVE:  60 y.o. G0P0 female for annual routine gynecologic exam. She has no gynecologic concerns.    Current Outpatient Medications  Medication Sig Dispense Refill  . BRIMONIDINE TARTRATE OP Apply to eye.    . Cholecalciferol (VITAMIN D PO) Take by mouth.    . DORZOLAMIDE HCL OP Apply to eye.    . estradiol (VIVELLE-DOT) 0.05 MG/24HR patch APPLY 1 PATCH EXTERNALLY TO THE SKIN 2 TIMES A WEEK 24 patch 4  . lisinopril (ZESTRIL) 2.5 MG tablet Take 2.5 mg by mouth daily.    . Misc Natural Products (FIBER 7 PO) Take by mouth.    . Multiple Vitamins-Minerals (OCUVITE ADULT 50+ PO) Take by mouth.    . Netarsudil Dimesylate 0.02 % SOLN Apply to eye.    . Travoprost, BAK Free, (TRAVATAN) 0.004 % SOLN ophthalmic solution 1 drop at bedtime.     No current facility-administered medications for this visit.   Allergies: Patient has no known allergies.  No LMP recorded. Patient has had a hysterectomy.  Past medical history,surgical history, problem list, medications, allergies, family history and social history were all reviewed and documented as reviewed in the EPIC chart.  ROS: Pertinent positives and negatives as reviewed in HPI   OBJECTIVE:  BP 138/88   Ht 5' 1.5" (1.562 m)   Wt 187 lb (84.8 kg)   BMI 34.76 kg/m  The patient appears well, alert, oriented, in no distress.  BREAST EXAM: breasts appear normal, no suspicious masses, no skin or nipple changes or axillary nodes  PELVIC EXAM: VULVA: normal appearing vulva with no masses, tenderness or lesions, VAGINA: normal appearing vagina with normal color and discharge, no lesions, CERVIX: surgically absent, UTERUS: surgically absent, vaginal cuff well healed, ADNEXA: no masses, non tender  Portia present during the examination  ASSESSMENT:  60 y.o. G0P0 here for annual gynecologic exam  PLAN:  1. Postmenopausal/HRT.  She does want to continue on Minivelle 0.05 mg patch.  She has a history  of a TAH/BSO for endometriosis.  Aware of the risks for use of estrogen therapy including thrombotic diseases, heart attack, stroke, DVT, PE, increased risk for breast cancer.  Benefits of relief of symptoms, and bone health are reviewed.  I have refilled her medication for another year supply. 2. Pap smear 2019.  Not repeated today. No prior history of abnormal Pap smears.  We discussed that screening guidelines and based on prior hysterectomy and normal Pap smear history she is comfortable with not continuing Pap smear surveillance at this point. 3. Mammogram 09/2020. Will continue with annual mammography. Breast exam normal today. 4. Colonoscopy 09/2020 through [facility]. Recommended that she continue per the prescribed interval she indicates she was told 5 years.  5. DEXA when indicated later into menopause. 6. Health maintenance.  No lab work as she has this completed with her primary care provider she also has hypertension managed.  Urged to keep an eye on her blood pressure as it is mildly elevated today.  The patient is aware that I will only be at this practice until early March 2022 so she knows to make sure she requests follow-up on results for any medical tests that she does when I am no longer at the practice.   Return annually or sooner, prn.  Theresia Majors MD  12/08/20

## 2021-08-11 ENCOUNTER — Other Ambulatory Visit: Payer: Self-pay | Admitting: Family Medicine

## 2021-08-11 DIAGNOSIS — Z1231 Encounter for screening mammogram for malignant neoplasm of breast: Secondary | ICD-10-CM

## 2021-09-19 ENCOUNTER — Ambulatory Visit
Admission: RE | Admit: 2021-09-19 | Discharge: 2021-09-19 | Disposition: A | Discharge status: Home / Self Care | Payer: Federal, State, Local not specified - PPO | Source: Ambulatory Visit | Attending: Family Medicine | Admitting: Family Medicine

## 2021-09-19 ENCOUNTER — Other Ambulatory Visit: Payer: Self-pay

## 2021-09-19 DIAGNOSIS — Z1231 Encounter for screening mammogram for malignant neoplasm of breast: Secondary | ICD-10-CM

## 2021-12-04 NOTE — Progress Notes (Signed)
61 y.o. G0P0 [name] here for annual exam.  H/O hysterectomy/BSO, on ERT. She is on a 0.05 mg estradiol patch, happy with it. Not currently wanting to decrease her dose. Discussed risk.  No bowel or bladder issues.    4 year old husband had a stroke in 1/19. In palliative care at home. Married x 26 years.   No LMP recorded. Patient has had a hysterectomy.          Sexually active: No.  The current method of family planning is status post hysterectomy.    Exercising: No.   exercise Smoker:  no  Health Maintenance: Pap:  11/08/17 Normal  History of abnormal Pap:  no MMG:  09/19/21 density B Bi-rads 1 neg  BMD:   none Colonoscopy: 11/21 at [facility], f/u 5 years TDaP:  06/16/13  Gardasil: n/a   reports that she has never smoked. She has never used smokeless tobacco. She reports that she does not drink alcohol and does not use drugs. She is an Hydrographic surveyor, works from home.   Past Medical History:  Diagnosis Date   Glaucoma    Hypertension     Past Surgical History:  Procedure Laterality Date   ABDOMINAL HYSTERECTOMY  2010   TAH BSO/ endometriosis   BREAST SURGERY     Breast reduction   EYE SURGERY Bilateral    REDUCTION MAMMAPLASTY Bilateral 1981   TRABECULECTOMY Right 2022    Current Outpatient Medications  Medication Sig Dispense Refill   brimonidine (ALPHAGAN) 0.15 % ophthalmic solution 1 drop 3 (three) times daily.     dorzolamide-timolol (COSOPT) 22.3-6.8 MG/ML ophthalmic solution 1 drop 2 (two) times daily.     estradiol (VIVELLE-DOT) 0.05 MG/24HR patch APPLY 1 PATCH EXTERNALLY TO THE SKIN 2 TIMES A WEEK 24 patch 4   lisinopril (ZESTRIL) 2.5 MG tablet Take 2.5 mg by mouth daily.     Misc Natural Products (FIBER 7 PO) Take by mouth.     Multiple Vitamin (MULTI-VITAMIN) tablet Take 1 tablet by mouth daily.     Travoprost, BAK Free, (TRAVATAN) 0.004 % SOLN ophthalmic solution 1 drop at bedtime.     No current  facility-administered medications for this visit.    Family History  Problem Relation Age of Onset   ALS Mother 42   Cancer Father 23       Prostate   Breast cancer Maternal Aunt 90   Breast cancer Cousin        mid 82's    Review of Systems  Constitutional: Negative.   HENT: Negative.    Eyes: Negative.   Respiratory: Negative.    Cardiovascular: Negative.   Gastrointestinal: Negative.   Endocrine: Negative.   Genitourinary: Negative.   Musculoskeletal: Negative.   Skin: Negative.   Allergic/Immunologic: Negative.   Neurological: Negative.   Hematological: Negative.   Psychiatric/Behavioral: Negative.     Exam:   BP 110/78    Pulse 78    Resp 16    Ht 5' 1.25" (1.556 m)    Wt 197 lb (89.4 kg)    BMI 36.92 kg/m   Weight change: @WEIGHTCHANGE @ Height:   Height: 5' 1.25" (155.6 cm)  Ht Readings from Last 3 Encounters:  12/13/21 5' 1.25" (1.556 m)  12/08/20 5' 1.5" (1.562 m)  12/04/19 5\' 2"  (1.575 m)    General appearance: alert, cooperative and appears stated age Head: Normocephalic, without obvious abnormality, atraumatic Neck: no adenopathy, supple, symmetrical, trachea midline and  thyroid normal to inspection and palpation Lungs: clear to auscultation bilaterally Cardiovascular: regular rate and rhythm Breasts: normal appearance, no masses or tenderness Abdomen: soft, non-tender; non distended,  no masses,  no organomegaly Extremities: extremities normal, atraumatic, no cyanosis or edema Skin: Skin color, texture, turgor normal. No rashes or lesions Lymph nodes: Cervical, supraclavicular, and axillary nodes normal. No abnormal inguinal nodes palpated Neurologic: Grossly normal   Pelvic: External genitalia:  no lesions              Urethra:  normal appearing urethra with no masses, tenderness or lesions              Bartholins and Skenes: normal                 Vagina: normal appearing vagina with normal color and discharge, no lesions              Cervix:  absent               Bimanual Exam:  Uterus:  uterus absent              Adnexa: no mass, fullness, tenderness               Rectovaginal: Confirms               Anus:  normal sphincter tone, no lesions  Gae Dry chaperoned for the exam.  1. Well woman exam Discussed breast self exam Discussed calcium and vit D intake Mammogram and Colonoscopy are UTD Labs with primary No pap needed  2. Encounter for monitoring postmenopausal estrogen replacement therapy Doing well, doesn't want to decrease her dose at this time (under too much stress). Aware of the risks of MI, stroke, blood clots and breast cancer - estradiol (VIVELLE-DOT) 0.05 MG/24HR patch; APPLY 1 PATCH EXTERNALLY TO THE SKIN 2 TIMES A WEEK  Dispense: 24 patch; Refill: 4  3. BMI 36.0-36.9,adult Had a rough year, on steroids for months, unable to exercise. Plans to start exercising again soon.  4. Hypertension, unspecified type On medication

## 2021-12-13 ENCOUNTER — Encounter: Payer: Self-pay | Admitting: Obstetrics and Gynecology

## 2021-12-13 ENCOUNTER — Ambulatory Visit (INDEPENDENT_AMBULATORY_CARE_PROVIDER_SITE_OTHER): Payer: Federal, State, Local not specified - PPO | Admitting: Obstetrics and Gynecology

## 2021-12-13 ENCOUNTER — Other Ambulatory Visit: Payer: Self-pay

## 2021-12-13 VITALS — BP 110/78 | HR 78 | Resp 16 | Ht 61.25 in | Wt 197.0 lb

## 2021-12-13 DIAGNOSIS — Z6836 Body mass index (BMI) 36.0-36.9, adult: Secondary | ICD-10-CM

## 2021-12-13 DIAGNOSIS — Z01419 Encounter for gynecological examination (general) (routine) without abnormal findings: Secondary | ICD-10-CM

## 2021-12-13 DIAGNOSIS — Z79890 Hormone replacement therapy (postmenopausal): Secondary | ICD-10-CM

## 2021-12-13 DIAGNOSIS — Z5181 Encounter for therapeutic drug level monitoring: Secondary | ICD-10-CM

## 2021-12-13 DIAGNOSIS — H269 Unspecified cataract: Secondary | ICD-10-CM | POA: Insufficient documentation

## 2021-12-13 DIAGNOSIS — H401290 Low-tension glaucoma, unspecified eye, stage unspecified: Secondary | ICD-10-CM | POA: Insufficient documentation

## 2021-12-13 DIAGNOSIS — I10 Essential (primary) hypertension: Secondary | ICD-10-CM

## 2021-12-13 MED ORDER — ESTRADIOL 0.05 MG/24HR TD PTTW: MEDICATED_PATCH | TRANSDERMAL | 4 refills | Status: DC

## 2021-12-13 NOTE — Patient Instructions (Signed)

## 2022-06-05 HISTORY — PX: CHOLECYSTECTOMY: SHX55

## 2022-06-06 DIAGNOSIS — K8050 Calculus of bile duct without cholangitis or cholecystitis without obstruction: Secondary | ICD-10-CM | POA: Insufficient documentation

## 2022-08-17 ENCOUNTER — Other Ambulatory Visit: Payer: Self-pay | Admitting: Family Medicine

## 2022-08-17 DIAGNOSIS — Z1231 Encounter for screening mammogram for malignant neoplasm of breast: Secondary | ICD-10-CM

## 2022-08-27 IMAGING — MG MM DIGITAL SCREENING BILAT W/ TOMO AND CAD
6 of 10 series · 6 of 30 positions shown · non-contrast
Comparison: Previous exam(s).

CLINICAL DATA: Screening.

EXAM:
DIGITAL SCREENING BILATERAL MAMMOGRAM WITH TOMOSYNTHESIS AND CAD
TECHNIQUE: Bilateral screening digital craniocaudal and mediolateral oblique
mammograms were obtained. Bilateral screening digital breast
tomosynthesis was performed. The images were evaluated with
computer-aided detection.

[L MLO synth-2D]
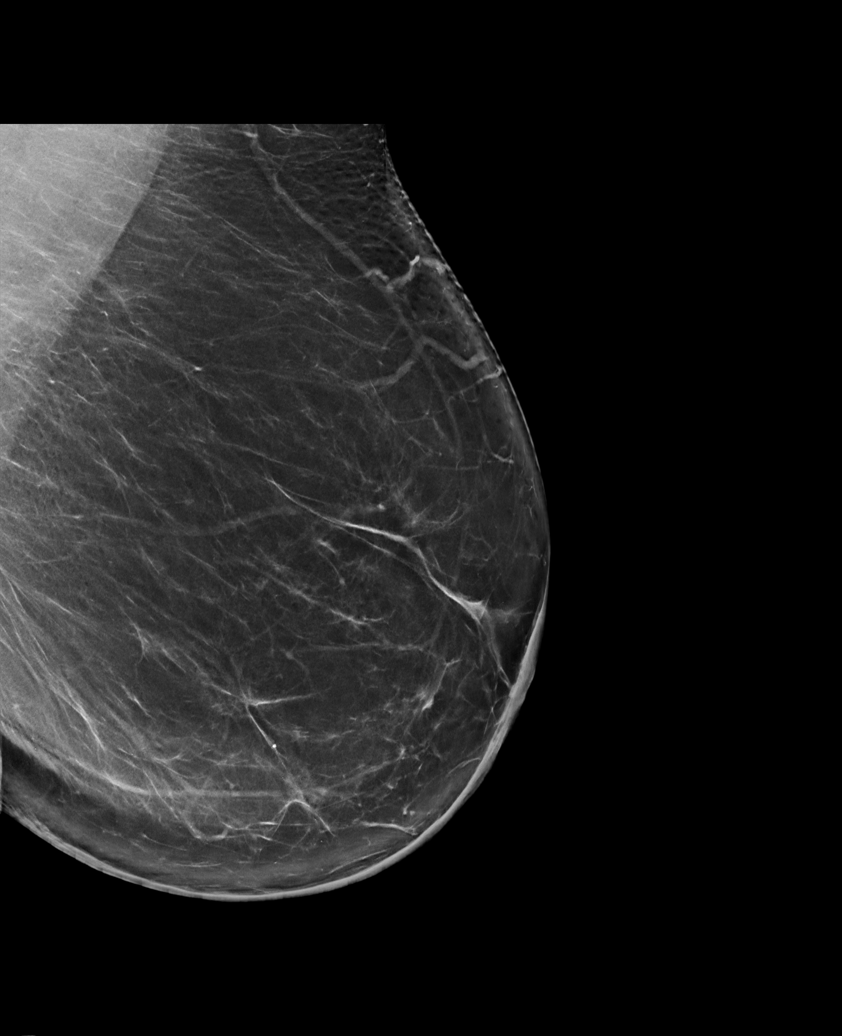

[L CC synth-2D (1 of 2)]
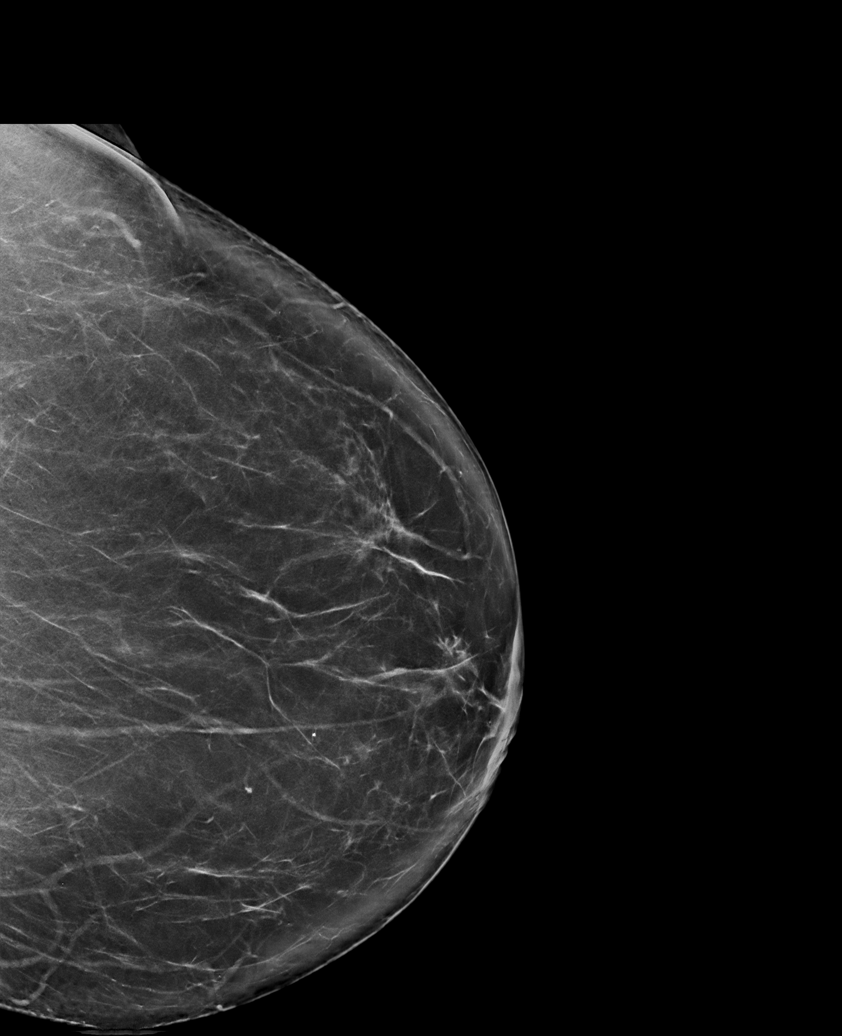

[R MLO synth-2D]
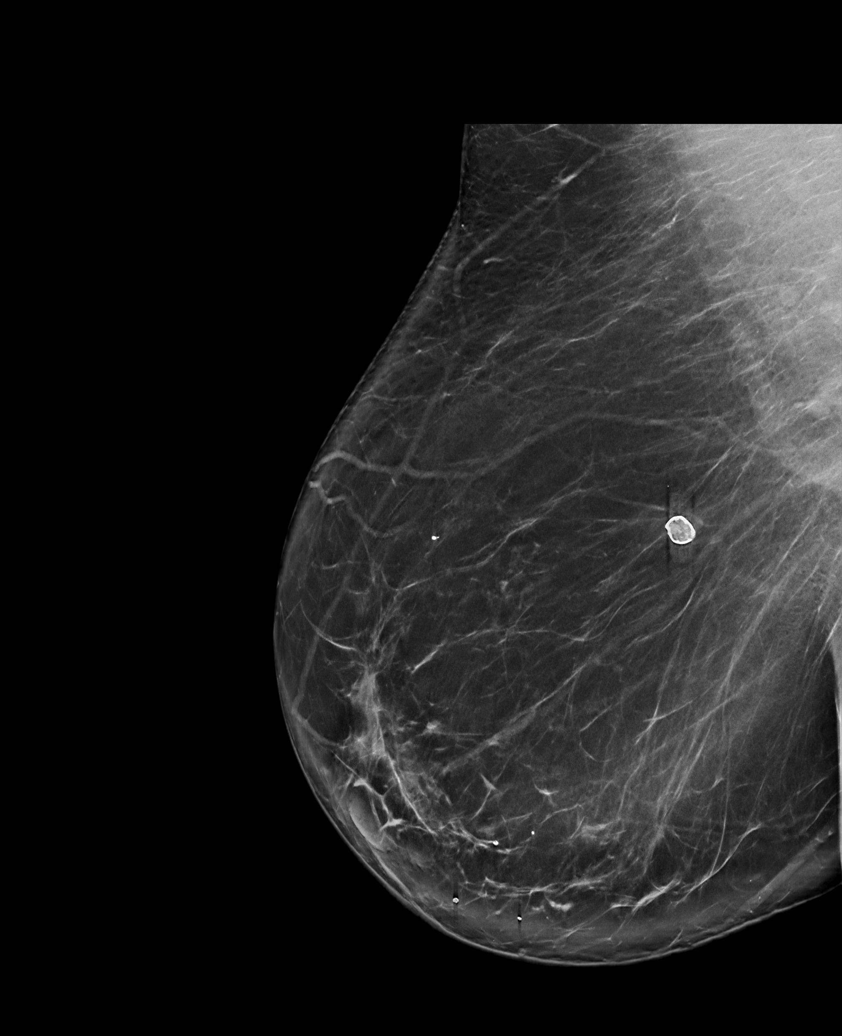

[L CC synth-2D (2 of 2)]
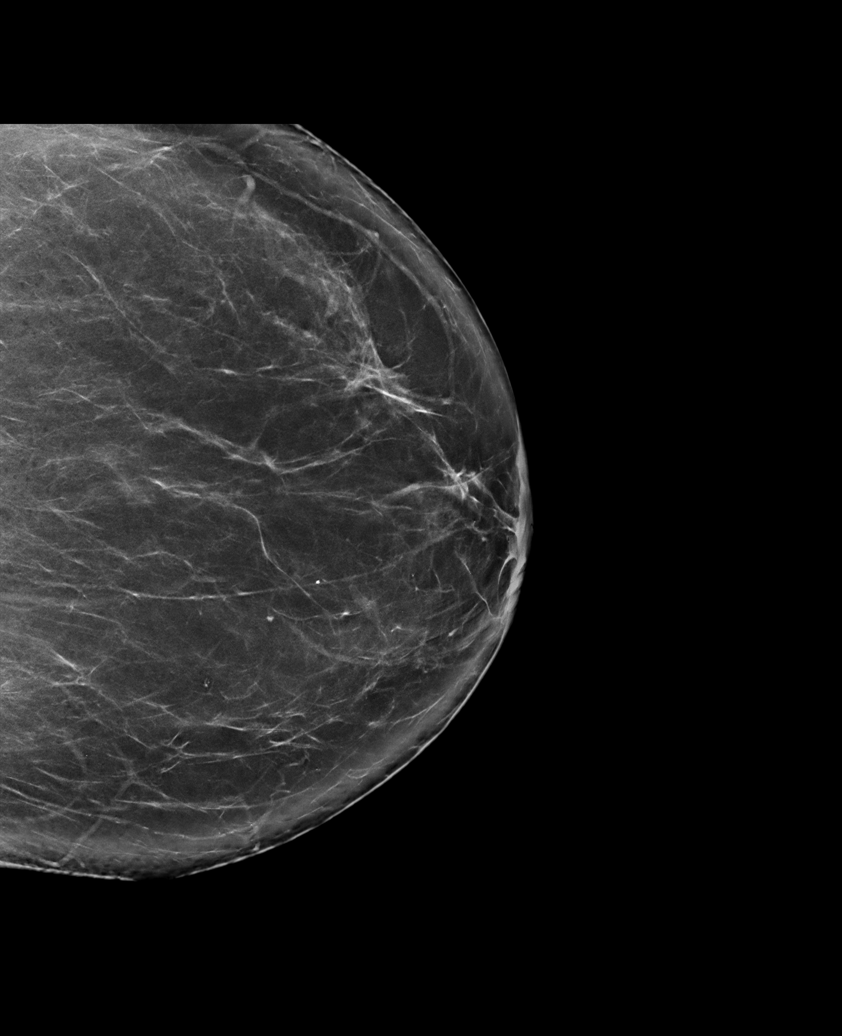

[R CC synth-2D]
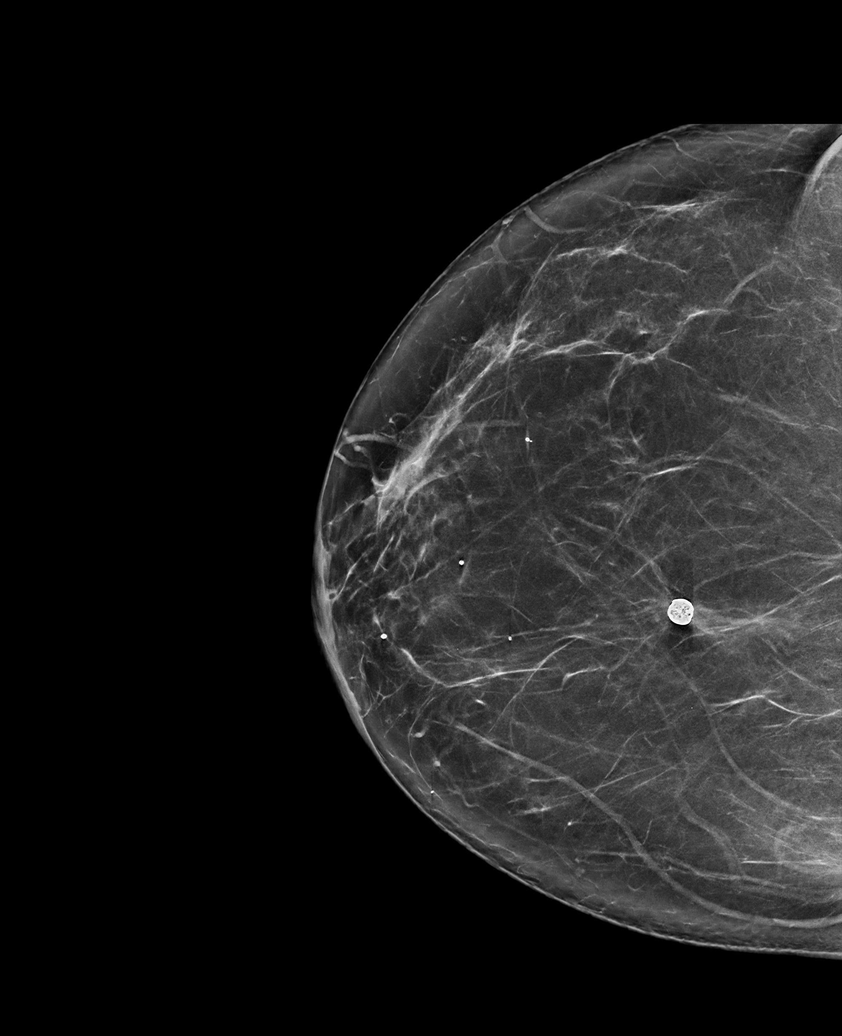

[L CC tomo · tomo slice 47/94.0]
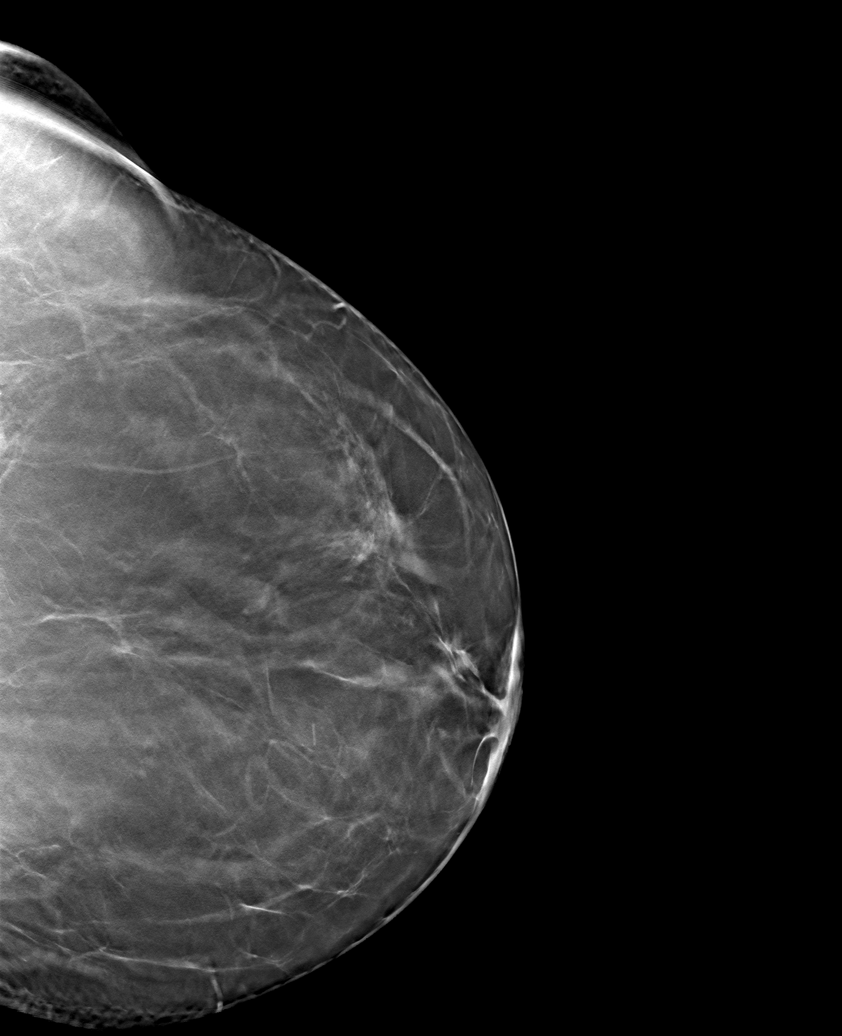

[6 of 30 positions shown; findings below may reference images not displayed]

ACR Breast Density Category b: There are scattered areas of
fibroglandular density.
FINDINGS: There are no findings suspicious for malignancy.
IMPRESSION: No mammographic evidence of malignancy. A result letter of this
screening mammogram will be mailed directly to the patient.

RECOMMENDATION:
Screening mammogram in one year. (Code:51-O-LD2)

BI-RADS CATEGORY  1: Negative.

## 2022-09-26 ENCOUNTER — Ambulatory Visit: Payer: Federal, State, Local not specified - PPO

## 2022-09-30 DIAGNOSIS — H25812 Combined forms of age-related cataract, left eye: Secondary | ICD-10-CM | POA: Insufficient documentation

## 2022-11-22 ENCOUNTER — Ambulatory Visit
Admission: RE | Admit: 2022-11-22 | Discharge: 2022-11-22 | Disposition: A | Discharge status: Home / Self Care | Payer: Federal, State, Local not specified - PPO | Source: Ambulatory Visit | Attending: Family Medicine | Admitting: Family Medicine

## 2022-11-22 DIAGNOSIS — Z1231 Encounter for screening mammogram for malignant neoplasm of breast: Secondary | ICD-10-CM

## 2022-12-12 NOTE — Progress Notes (Signed)
62 y.o. G0P0 [name] here for annual exam.  H/O hysterectomy/BSO on ERT. She is happy with her ERT and wants to continue at the current dose.    She has glaucoma, has had issues and has needed surgery on one eye and will need it in the other eye.   She is down 26 lbs from her visit here last year.   16 year old husband had a stroke in 1/19, worsening dementia. In palliative care at home. They have had a good marriage, married x 27 years.   No LMP recorded. Patient has had a hysterectomy.          Sexually active: No.  The current method of family planning is status post hysterectomy.    Exercising: Yes.     walking Smoker:  no  Health Maintenance: Pap:  11/08/17 Normal  History of abnormal Pap:  no MMG:  11-23-22 density B Bi-rads Bi-rads 1 neg  BMD:   none  Colonoscopy:  11/21 at [facility], f/u 5 years TDaP:  06/16/13  Gardasil: n/a   reports that she has never smoked. She has never used smokeless tobacco. She reports that she does not drink alcohol and does not use drugs.  She is an Hydrographic surveyor, works from home.  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   Past Medical History:  Diagnosis Date   Glaucoma    Hypertension     Past Surgical History:  Procedure Laterality Date   ABDOMINAL HYSTERECTOMY  2010   TAH BSO/ endometriosis   BREAST SURGERY     Breast reduction   CHOLECYSTECTOMY  06/05/2022   EYE SURGERY Bilateral    cateract   REDUCTION MAMMAPLASTY Bilateral 1981   TRABECULECTOMY Right 2022    Current Outpatient Medications  Medication Sig Dispense Refill   brimonidine (ALPHAGAN) 0.15 % ophthalmic solution 1 drop 3 (three) times daily.     dorzolamide-timolol (COSOPT) 22.3-6.8 MG/ML ophthalmic solution 1 drop 2 (two) times daily.     estradiol (VIVELLE-DOT) 0.05 MG/24HR patch APPLY 1 PATCH EXTERNALLY TO THE SKIN 2 TIMES A WEEK 24 patch 4   lisinopril (ZESTRIL) 2.5 MG tablet Take 2.5 mg by mouth daily.     Misc Natural Products  (FIBER 7 PO) Take by mouth.     Multiple Vitamin (MULTI-VITAMIN) tablet Take 1 tablet by mouth daily.     Travoprost, BAK Free, (TRAVATAN) 0.004 % SOLN ophthalmic solution 1 drop at bedtime.     No current facility-administered medications for this visit.    Family History  Problem Relation Age of Onset   ALS Mother 47   Cancer Father 66       Prostate   Breast cancer Maternal Aunt 90   Breast cancer Cousin        mid 30's    Review of Systems  All other systems reviewed and are negative.   Exam:   BP 110/62   Pulse 78   Ht 5' 1"$  (1.549 m)   Wt 171 lb (77.6 kg)   SpO2 100%   BMI 32.31 kg/m   Weight change: @WEIGHTCHANGE$ @ Height:   Height: 5' 1"$  (154.9 cm)  Ht Readings from Last 3 Encounters:  12/19/22 5' 1"$  (1.549 m)  12/13/21 5' 1.25" (1.556 m)  12/08/20 5' 1.5" (1.562 m)    General appearance: alert, cooperative and appears stated age Head: Normocephalic, without obvious abnormality, atraumatic Neck: no adenopathy, supple, symmetrical, trachea midline and thyroid normal to inspection and  palpation Lungs: clear to auscultation bilaterally Cardiovascular: regular rate and rhythm Breasts: normal appearance, no masses or tenderness Abdomen: soft, non-tender; non distended,  no masses,  no organomegaly Extremities: extremities normal, atraumatic, no cyanosis or edema Skin: Skin color, texture, turgor normal. No rashes or lesions Lymph nodes: Cervical, supraclavicular, and axillary nodes normal. No abnormal inguinal nodes palpated Neurologic: Grossly normal   Pelvic: External genitalia:  no lesions              Urethra:  normal appearing urethra with no masses, tenderness or lesions              Bartholins and Skenes: normal                 Vagina: normal appearing vagina with normal color and discharge, no lesions              Cervix: absent               Bimanual Exam:  Uterus:  uterus absent              Adnexa: no mass, fullness, tenderness                Rectovaginal: Confirms               Anus:  normal sphincter tone, no lesions   1. Well woman exam Discussed breast self exam Discussed calcium and vit D intake Mammogram and colonoscopy UTD No pap needed Labs and immunizations with her primary  2. Encounter for monitoring postmenopausal estrogen replacement therapy She is doing well on ERT and wants to continue. Reviewed the risks.  - estradiol (VIVELLE-DOT) 0.05 MG/24HR patch; APPLY 1 PATCH EXTERNALLY TO THE SKIN 2 TIMES A WEEK  Dispense: 24 patch; Refill: 4

## 2022-12-19 ENCOUNTER — Encounter: Payer: Self-pay | Admitting: Obstetrics and Gynecology

## 2022-12-19 ENCOUNTER — Ambulatory Visit (INDEPENDENT_AMBULATORY_CARE_PROVIDER_SITE_OTHER): Payer: Federal, State, Local not specified - PPO | Admitting: Obstetrics and Gynecology

## 2022-12-19 VITALS — BP 110/62 | HR 78 | Ht 61.0 in | Wt 171.0 lb

## 2022-12-19 DIAGNOSIS — Z01419 Encounter for gynecological examination (general) (routine) without abnormal findings: Secondary | ICD-10-CM | POA: Diagnosis not present

## 2022-12-19 DIAGNOSIS — Z79890 Hormone replacement therapy (postmenopausal): Secondary | ICD-10-CM | POA: Diagnosis not present

## 2022-12-19 DIAGNOSIS — Z5181 Encounter for therapeutic drug level monitoring: Secondary | ICD-10-CM

## 2022-12-19 MED ORDER — ESTRADIOL 0.05 MG/24HR TD PTTW: MEDICATED_PATCH | TRANSDERMAL | 4 refills | Status: DC

## 2022-12-19 NOTE — Patient Instructions (Signed)

## 2023-10-24 ENCOUNTER — Other Ambulatory Visit: Payer: Self-pay | Admitting: Family Medicine

## 2023-10-24 DIAGNOSIS — Z0000 Encounter for general adult medical examination without abnormal findings: Secondary | ICD-10-CM

## 2024-01-23 ENCOUNTER — Encounter: Payer: Self-pay | Admitting: Obstetrics and Gynecology

## 2024-01-23 ENCOUNTER — Ambulatory Visit (INDEPENDENT_AMBULATORY_CARE_PROVIDER_SITE_OTHER): Payer: Federal, State, Local not specified - PPO | Admitting: Obstetrics and Gynecology

## 2024-01-23 VITALS — BP 120/82 | HR 71 | Ht 61.25 in | Wt 187.0 lb

## 2024-01-23 DIAGNOSIS — E2839 Other primary ovarian failure: Secondary | ICD-10-CM | POA: Diagnosis not present

## 2024-01-23 DIAGNOSIS — Z01419 Encounter for gynecological examination (general) (routine) without abnormal findings: Secondary | ICD-10-CM

## 2024-01-23 DIAGNOSIS — Z1331 Encounter for screening for depression: Secondary | ICD-10-CM

## 2024-01-23 DIAGNOSIS — Z1231 Encounter for screening mammogram for malignant neoplasm of breast: Secondary | ICD-10-CM

## 2024-01-23 DIAGNOSIS — Z79890 Hormone replacement therapy (postmenopausal): Secondary | ICD-10-CM | POA: Diagnosis not present

## 2024-01-23 MED ORDER — ESTRADIOL 0.05 MG/24HR TD PTTW: MEDICATED_PATCH | TRANSDERMAL | 4 refills | Status: AC

## 2024-01-23 NOTE — Patient Instructions (Signed)
 You will need an updated bone scan.  They will call to schedule with radiology Dr. Karma Greaser

## 2024-01-23 NOTE — Progress Notes (Signed)
 63 y.o. y.o. female here for annual exam. No LMP recorded. Patient has had a hysterectomy.    H/O hysterectomy/BSO on ERT. She is happy with her ERT and wants to continue at the current dose.    She has glaucoma, has had issues and has needed surgery on one eye and will need it in the other eye.   She is down 26 lbs from her visit here last year.   31 year old husband had a stroke in 1/19, worsening dementia. In palliative care at home. They have had a good marriage, married x 27 years.   No LMP recorded. Patient has had a hysterectomy with BSO at age of 21 for endometriosis with endometrioma    Sexually active: No.  The current method of family planning is status post hysterectomy.    Exercising: Yes.    walking Smoker:  no  Health Maintenance: Pap:  11/08/17 Normal  denies any abnormal pap smears. Can stop testing History of abnormal Pap:  no MMG:  11-23-22 density B Bi-rads Bi-rads 1 neg  BMD:   none referral placed Colonoscopy:  11/21 at [facility], f/u 5 years TDaP:  06/16/13  Gardasil: n/a Body mass index is 35.05 kg/m.     01/23/2024    3:05 PM  Depression screen PHQ 2/9  Decreased Interest 0  Down, Depressed, Hopeless 0  PHQ - 2 Score 0    Blood pressure 120/82, pulse 71, height 5' 1.25" (1.556 m), weight 187 lb (84.8 kg), SpO2 98%.  No results found for: "DIAGPAP", "HPVHIGH", "ADEQPAP"  GYN HISTORY: No results found for: "DIAGPAP", "HPVHIGH", "ADEQPAP"  OB History  Gravida Para Term Preterm AB Living  0       SAB IAB Ectopic Multiple Live Births          Past Medical History:  Diagnosis Date   Glaucoma    Hypertension     Past Surgical History:  Procedure Laterality Date   ABDOMINAL HYSTERECTOMY  2010   TAH BSO/ endometriosis   BREAST SURGERY     Breast reduction   CHOLECYSTECTOMY  06/05/2022   EYE SURGERY Bilateral    cateract   REDUCTION MAMMAPLASTY Bilateral 1981   TRABECULECTOMY Right 2022    Current Outpatient Medications on File Prior  to Visit  Medication Sig Dispense Refill   brimonidine (ALPHAGAN) 0.2 % ophthalmic solution Place 1 drop into the right eye 3 (three) times daily.     dorzolamide-timolol (COSOPT) 22.3-6.8 MG/ML ophthalmic solution 1 drop 2 (two) times daily.     estradiol (VIVELLE-DOT) 0.05 MG/24HR patch APPLY 1 PATCH EXTERNALLY TO THE SKIN 2 TIMES A WEEK 24 patch 4   latanoprost (XALATAN) 0.005 % ophthalmic solution SMARTSIG:In Eye(s)     lisinopril (ZESTRIL) 2.5 MG tablet Take 2.5 mg by mouth daily.     Misc Natural Products (FIBER 7 PO) Take by mouth.     Multiple Vitamin (MULTI-VITAMIN) tablet Take 1 tablet by mouth daily.     No current facility-administered medications on file prior to visit.    Social History   Socioeconomic History   Marital status: Married    Spouse name: Not on file   Number of children: Not on file   Years of education: Not on file   Highest education level: Not on file  Occupational History   Not on file  Tobacco Use   Smoking status: Never   Smokeless tobacco: Never  Vaping Use   Vaping status: Never Used  Substance  and Sexual Activity   Alcohol use: No    Alcohol/week: 0.0 standard drinks of alcohol   Drug use: No   Sexual activity: Not Currently    Partners: Male    Birth control/protection: Surgical    Comment: 1st intercourse 63 yo-Fewer than 5 partners, hysterectomy  Other Topics Concern   Not on file  Social History Narrative   Not on file   Social Drivers of Health   Financial Resource Strain: Low Risk  (02/18/2023)   Received from [facility]   Overall Financial Resource Strain (CARDIA)    Difficulty of Paying Living Expenses: Not hard at all  Food Insecurity: No Food Insecurity (02/18/2023)   Received from [facility]   Hunger Vital Sign    Worried About Running Out of Food in the Last Year: Never true    Ran Out of Food in the Last Year: Never true  Transportation Needs: No Transportation Needs (02/18/2023)   Received from [facility] - Transportation    Lack of Transportation (Medical): No    Lack of Transportation (Non-Medical): No  Physical Activity: Not on file  Stress: No Stress Concern Present (08/09/2022)   Received from [facility], [facility] - Occupational Stress Questionnaire    Feeling of Stress : Only a little  Social Connections: Unknown (03/08/2022)   Received from [facility], [facility]   Social Network    Social Network: Not on file  Intimate Partner Violence: Unknown (02/05/2022)   Received from [facility], [facility]   HITS    Physically Hurt: Not on file    Insult or Talk Down To: Not on file    Threaten Physical Harm: Not on file    Scream or Curse: Not on file    Family History  Problem Relation Age of Onset   ALS Mother 73   Cancer Father 44       Prostate   Breast cancer Maternal Aunt 91   Breast cancer Cousin        mid 40's     Allergies  Allergen Reactions   Difluprednate     Other Reaction(s): Other (See Comments)  Flushing in her face/her face kind of looked sunburned per patient .   Silicone Rash    Paper tape is ok to use      Patient's last menstrual period was No LMP recorded. Patient has had a hysterectomy..             Review of Systems Alls systems reviewed and are negative.     Physical Exam Constitutional:      Appearance: Normal appearance.  Genitourinary:     No lesions in the vagina.     Right Labia: No rash, tenderness, lesions, skin changes or Bartholin's cyst.    Left Labia: No tenderness, lesions, skin changes, Bartholin's cyst or rash.    No vaginal discharge, erythema, tenderness, bleeding, ulceration or cuff induration.     Moderate vaginal atrophy present.     Right Adnexa: absent.    Left Adnexa: absent.    Cervix is absent.     Uterus is absent.  Cardiovascular:     Rate and Rhythm: Normal rate and regular rhythm.  Pulmonary:     Effort: Pulmonary effort is normal.      Breath sounds: Normal breath sounds.  Chest:     Comments: Breast exam with no palpable masses Reduction scars seen b/l  Abdominal:     General: Abdomen is flat. Bowel sounds are normal.     Palpations: Abdomen is soft.  Musculoskeletal:     Cervical back: Normal range of motion.  Neurological:     Mental Status: She is alert and oriented to person, place, and time.  Psychiatric:        Mood and Affect: Mood normal.        Behavior: Behavior normal.  Vitals and nursing note reviewed.       A:         Well Woman GYN exam                             P:        Pap smear not indicated Encouraged annual mammogram screening Colon cancer screening up-to-date DXA ordered today Labs and immunizations to do with PMD Encouraged healthy lifestyle practices Encouraged Vit D and Calcium   No follow-ups on file.  Earley Favor

## 2024-01-24 ENCOUNTER — Ambulatory Visit
Admission: RE | Admit: 2024-01-24 | Discharge: 2024-01-24 | Disposition: A | Discharge status: Home / Self Care | Payer: Federal, State, Local not specified - PPO | Source: Ambulatory Visit | Attending: Family Medicine | Admitting: Family Medicine

## 2024-01-24 DIAGNOSIS — Z0000 Encounter for general adult medical examination without abnormal findings: Secondary | ICD-10-CM

## 2025-01-25 ENCOUNTER — Ambulatory Visit: Admitting: Obstetrics and Gynecology
